# Patient Record
Sex: Female | Born: 1990
Health system: Southern US, Community
[De-identification: ages and names within clinical notes are randomized; demographics above are authoritative.]

## PROBLEM LIST (undated history)

## (undated) DIAGNOSIS — R87629 Unspecified abnormal cytological findings in specimens from vagina: Secondary | ICD-10-CM

## (undated) DIAGNOSIS — F4310 Post-traumatic stress disorder, unspecified: Secondary | ICD-10-CM

## (undated) DIAGNOSIS — N912 Amenorrhea, unspecified: Secondary | ICD-10-CM

## (undated) DIAGNOSIS — F909 Attention-deficit hyperactivity disorder, unspecified type: Secondary | ICD-10-CM

## (undated) DIAGNOSIS — G43909 Migraine, unspecified, not intractable, without status migrainosus: Secondary | ICD-10-CM

## (undated) HISTORY — DX: Unspecified abnormal cytological findings in specimens from vagina: R87.629

## (undated) HISTORY — PX: SHOULDER ARTHROSCOPY: SHX128

## (undated) HISTORY — DX: Post-traumatic stress disorder, unspecified: F43.10

## (undated) HISTORY — DX: Amenorrhea, unspecified: N91.2

## (undated) HISTORY — DX: Attention-deficit hyperactivity disorder, unspecified type: F90.9

## (undated) HISTORY — DX: Migraine, unspecified, not intractable, without status migrainosus: G43.909

---
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
## 2010-01-10 DIAGNOSIS — F4310 Post-traumatic stress disorder, unspecified: Secondary | ICD-10-CM

## 2010-01-10 HISTORY — DX: Post-traumatic stress disorder, unspecified: F43.10

## 2010-05-12 HISTORY — PX: KNEE ARTHROSCOPY: SUR90

## 2014-02-01 ENCOUNTER — Encounter: Payer: Self-pay | Admitting: *Deleted

## 2014-02-23 ENCOUNTER — Encounter: Payer: Self-pay | Admitting: Family Medicine

## 2014-02-23 ENCOUNTER — Ambulatory Visit (INDEPENDENT_AMBULATORY_CARE_PROVIDER_SITE_OTHER): Payer: No Typology Code available for payment source | Admitting: Family Medicine

## 2014-02-23 VITALS — BP 104/68 | HR 88 | Temp 98.8°F | Resp 18 | Ht 62.25 in | Wt 168.0 lb

## 2014-02-23 DIAGNOSIS — E669 Obesity, unspecified: Secondary | ICD-10-CM

## 2014-02-23 DIAGNOSIS — E663 Overweight: Secondary | ICD-10-CM | POA: Insufficient documentation

## 2014-02-23 DIAGNOSIS — G43909 Migraine, unspecified, not intractable, without status migrainosus: Secondary | ICD-10-CM

## 2014-02-23 DIAGNOSIS — R5383 Other fatigue: Secondary | ICD-10-CM

## 2014-02-23 DIAGNOSIS — Z0000 Encounter for general adult medical examination without abnormal findings: Secondary | ICD-10-CM

## 2014-02-23 DIAGNOSIS — F909 Attention-deficit hyperactivity disorder, unspecified type: Secondary | ICD-10-CM

## 2014-02-23 DIAGNOSIS — Z113 Encounter for screening for infections with a predominantly sexual mode of transmission: Secondary | ICD-10-CM

## 2014-02-23 DIAGNOSIS — R5381 Other malaise: Secondary | ICD-10-CM

## 2014-02-23 DIAGNOSIS — F901 Attention-deficit hyperactivity disorder, predominantly hyperactive type: Secondary | ICD-10-CM

## 2014-02-23 MED ORDER — METHYLPHENIDATE HCL 10 MG PO TABS: ORAL_TABLET | ORAL | Status: DC

## 2014-02-23 MED ORDER — RIZATRIPTAN BENZOATE 10 MG PO TBDP: 10.0000 mg | ORAL_TABLET | ORAL | Status: DC | PRN

## 2014-02-23 MED ORDER — ONDANSETRON HCL 8 MG PO TABS: 8.0000 mg | ORAL_TABLET | Freq: Three times a day (TID) | ORAL | Status: DC | PRN

## 2014-02-23 MED ORDER — NORGESTIM-ETH ESTRAD TRIPHASIC 0.18/0.215/0.25 MG-25 MCG PO TABS: 1.0000 | ORAL_TABLET | Freq: Every day | ORAL | Status: DC

## 2014-02-23 MED ORDER — METHYLPHENIDATE HCL ER (OSM) 18 MG PO TBCR: 18.0000 mg | EXTENDED_RELEASE_TABLET | Freq: Every day | ORAL | Status: DC

## 2014-02-23 NOTE — Patient Instructions (Signed)
F/U 4 weeks for medications Try Melatonin We will call with lab results  Restart Concerta  Release of records- previous PCP, need PAP Smear

## 2014-02-23 NOTE — Assessment & Plan Note (Signed)
Restart Concerta at 18mg  once a day,  Ritalin prn evening 5-10mg  Recheck 4 weeks
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
## 2014-02-23 NOTE — Addendum Note (Signed)
Addended by: Milinda AntisURHAM, Eilee Schader F on: 02/23/2014 05:42 PM   Modules accepted: Level of Service

## 2014-02-24 LAB — CBC WITH DIFFERENTIAL/PLATELET
BASOS ABS: 0 10*3/uL (ref 0.0–0.1)
Basophils Relative: 0 % (ref 0–1)
EOS ABS: 0.1 10*3/uL (ref 0.0–0.7)
Eosinophils Relative: 1 % (ref 0–5)
HCT: 35.9 % — ABNORMAL LOW (ref 36.0–46.0)
Hemoglobin: 11.9 g/dL — ABNORMAL LOW (ref 12.0–15.0)
LYMPHS PCT: 31 % (ref 12–46)
Lymphs Abs: 1.9 10*3/uL (ref 0.7–4.0)
MCH: 29.5 pg (ref 26.0–34.0)
MCHC: 33.1 g/dL (ref 30.0–36.0)
MCV: 88.9 fL (ref 78.0–100.0)
Monocytes Absolute: 0.5 10*3/uL (ref 0.1–1.0)
Monocytes Relative: 9 % (ref 3–12)
NEUTROS PCT: 59 % (ref 43–77)
Neutro Abs: 3.5 10*3/uL (ref 1.7–7.7)
PLATELETS: 273 10*3/uL (ref 150–400)
RBC: 4.04 MIL/uL (ref 3.87–5.11)
RDW: 13.1 % (ref 11.5–15.5)
WBC: 6 10*3/uL (ref 4.0–10.5)

## 2014-02-24 LAB — HIV ANTIBODY (ROUTINE TESTING W REFLEX): HIV 1&2 Ab, 4th Generation: NONREACTIVE

## 2014-02-24 LAB — TSH: TSH: 1.68 u[IU]/mL (ref 0.350–4.500)

## 2014-02-24 LAB — LIPID PANEL
CHOL/HDL RATIO: 2.6 ratio
Cholesterol: 126 mg/dL (ref 0–200)
HDL: 49 mg/dL (ref >39)
LDL CALC: 64 mg/dL (ref 0–99)
Triglycerides: 67 mg/dL (ref <150)
VLDL: 13 mg/dL (ref 0–40)

## 2014-02-24 LAB — COMPREHENSIVE METABOLIC PANEL
ALK PHOS: 59 U/L (ref 39–117)
ALT: 8 U/L (ref 0–35)
AST: 13 U/L (ref 0–37)
Albumin: 3.9 g/dL (ref 3.5–5.2)
BILIRUBIN TOTAL: 0.2 mg/dL (ref 0.2–1.2)
BUN: 8 mg/dL (ref 6–23)
CALCIUM: 8.8 mg/dL (ref 8.4–10.5)
CO2: 25 mEq/L (ref 19–32)
CREATININE: 0.73 mg/dL (ref 0.50–1.10)
Chloride: 107 mEq/L (ref 96–112)
Glucose, Bld: 82 mg/dL (ref 70–99)
Potassium: 4.5 mEq/L (ref 3.5–5.3)
Sodium: 140 mEq/L (ref 135–145)
Total Protein: 6.5 g/dL (ref 6.0–8.3)

## 2014-03-23 ENCOUNTER — Ambulatory Visit (INDEPENDENT_AMBULATORY_CARE_PROVIDER_SITE_OTHER): Payer: No Typology Code available for payment source | Admitting: Family Medicine

## 2014-03-23 ENCOUNTER — Encounter: Payer: Self-pay | Admitting: Family Medicine

## 2014-03-23 VITALS — BP 104/58 | HR 88 | Temp 98.2°F | Resp 16 | Ht 62.0 in | Wt 165.0 lb

## 2014-03-23 DIAGNOSIS — F909 Attention-deficit hyperactivity disorder, unspecified type: Secondary | ICD-10-CM

## 2014-03-23 DIAGNOSIS — G4700 Insomnia, unspecified: Secondary | ICD-10-CM

## 2014-03-23 DIAGNOSIS — F901 Attention-deficit hyperactivity disorder, predominantly hyperactive type: Secondary | ICD-10-CM

## 2014-03-23 MED ORDER — METHYLPHENIDATE HCL 10 MG PO TABS: ORAL_TABLET | ORAL | Status: DC

## 2014-03-23 MED ORDER — METHYLPHENIDATE HCL ER (OSM) 27 MG PO TBCR: 27.0000 mg | EXTENDED_RELEASE_TABLET | ORAL | Status: DC

## 2014-03-23 MED ORDER — SUVOREXANT 10 MG PO TABS: 1.0000 | ORAL_TABLET | Freq: Every day | ORAL | Status: DC

## 2014-03-23 NOTE — Progress Notes (Signed)
Patient ID: Lindsey Edwards, female   DOB: 02/03/1991, 23 y.o.   MRN: [id number]   Subjective:    Patient ID: Lindsey RuddyJessica Edwards, female    DOB: 06/10/1991, 23 y.o.   MRN: [id number]  Patient presents for 4 week F/U  patient here for interim followup on her medications. Last visit she was restarted on Concerta 18 mg and Ritalin 5-10 mg daily as needed. She states the Ritalin is doing well the evening however the Concerta is not quite strong enough she will like to increase 27 mg. She will be started her emergency response course next week.  Insomnia she continues to have difficulty sleeping. She did request lorazepam states that this has worked she has been on doxepin, trazodone, amitriptyline, Elavil, melatonin with no improvement.    Review Of Systems:  GEN- denies fatigue, fever, weight loss,weakness, recent illness HEENT- denies eye drainage, change in vision, nasal discharge, CVS- denies chest pain, palpitations RESP- denies SOB, cough, wheeze Neuro- denies headache, dizziness, syncope, seizure activity       Objective:    BP 104/58  Pulse 88  Temp(Src) 98.2 F (36.8 C) (Oral)  Resp 16  Ht 5\' 2"  (1.575 m)  Wt 165 lb (74.844 kg)  BMI 30.17 kg/m2  LMP 03/18/2014 GEN- NAD, alert and oriented x3 Psych- normal affect and mood        Assessment & Plan:      Problem List Items Addressed This Visit   None      Note: This dictation was prepared with Dragon dictation along with smaller phrase technology. Any transcriptional errors that result from this process are unintentional.

## 2014-03-23 NOTE — Assessment & Plan Note (Signed)
She's tried multiple medication and has had some side effects to trazodone. I do not want to put her on another habit-forming medication and she is currently on Ritalin and Concerta. I've given her prescription for the new Belsomra- she was given a voucher for 10 days and then a prescription card.

## 2014-03-23 NOTE — Patient Instructions (Addendum)
F/U 3 months for medications Concerta increased to 27mg   Try the new sleep medication Belsomra 10mg  at bedtime

## 2014-03-23 NOTE — Assessment & Plan Note (Signed)
Increase Concerta 27 mg extended release she was given 3 prescriptions one for June July and August I also refilled her Ritalin

## 2014-06-25 ENCOUNTER — Ambulatory Visit: Payer: No Typology Code available for payment source | Admitting: Family Medicine

## 2014-06-29 ENCOUNTER — Telehealth: Payer: Self-pay | Admitting: Family Medicine

## 2014-06-29 ENCOUNTER — Telehealth: Payer: Self-pay | Admitting: *Deleted

## 2014-06-29 MED ORDER — NORGESTIM-ETH ESTRAD TRIPHASIC 0.18/0.215/0.25 MG-25 MCG PO TABS: 1.0000 | ORAL_TABLET | Freq: Every day | ORAL | Status: DC

## 2014-06-29 NOTE — Telephone Encounter (Signed)
Patient left a message on machine saying she would like to talk about one of her current medications please call her back at [phone]

## 2014-06-29 NOTE — Telephone Encounter (Signed)
This has been taken care of.

## 2014-06-29 NOTE — Telephone Encounter (Signed)
Call placed to patient.   Patient reports that she can no longer afford the Ortho Tri-Cyclen Lo, but requires birth control.   Requested to be changed back to her previous contraception, but can not remember the name.   MD please advise.

## 2014-06-29 NOTE — Telephone Encounter (Signed)
Send in Tri Sprintec 1 tab daily

## 2014-06-29 NOTE — Telephone Encounter (Signed)
Prescription sent to pharmacy. .   Call placed to patient and patient made aware.  

## 2014-06-29 NOTE — Telephone Encounter (Signed)
Pt called back stating the name of the medication is called Tri-Sprintec, states she was on Tri-cyclen and it was expensive and having side effects from it prefers to get back on tri sprintec, please advise

## 2014-08-09 ENCOUNTER — Telehealth: Payer: Self-pay | Admitting: Family Medicine

## 2014-08-09 NOTE — Telephone Encounter (Signed)
Patient is calling to get rx for her her concerta [phone]

## 2014-08-09 NOTE — Telephone Encounter (Signed)
Give 1 month supply only, needs appt before any further refills

## 2014-08-09 NOTE — Telephone Encounter (Signed)
Ok to refill??  Last office visit/ refill 03/23/2014, #3 prescriptions given.   Patient NS for 3 month F/U on 06/25/2014.

## 2014-08-10 ENCOUNTER — Telehealth: Payer: Self-pay | Admitting: Family Medicine

## 2014-08-10 MED ORDER — METHYLPHENIDATE HCL ER (OSM) 27 MG PO TBCR: 27.0000 mg | EXTENDED_RELEASE_TABLET | ORAL | Status: DC

## 2014-08-10 NOTE — Telephone Encounter (Signed)
[phone]  Pt is wanting to know if she will have to come in every month to pick up her methylphenidate (CONCERTA) 27 MG PO CR tablet

## 2014-08-10 NOTE — Telephone Encounter (Signed)
Rx printed for provider signature.  Pt made aware RX ready and will need appt before further refills

## 2014-08-13 NOTE — Telephone Encounter (Signed)
lmtrc

## 2014-08-28 ENCOUNTER — Encounter: Payer: Self-pay | Admitting: Family Medicine

## 2014-08-28 ENCOUNTER — Ambulatory Visit (INDEPENDENT_AMBULATORY_CARE_PROVIDER_SITE_OTHER): Payer: No Typology Code available for payment source | Admitting: Family Medicine

## 2014-08-28 VITALS — BP 110/58 | HR 88 | Temp 98.4°F | Resp 16 | Ht 62.0 in | Wt 162.0 lb

## 2014-08-28 DIAGNOSIS — F901 Attention-deficit hyperactivity disorder, predominantly hyperactive type: Secondary | ICD-10-CM

## 2014-08-28 DIAGNOSIS — Z23 Encounter for immunization: Secondary | ICD-10-CM

## 2014-08-28 DIAGNOSIS — G43009 Migraine without aura, not intractable, without status migrainosus: Secondary | ICD-10-CM

## 2014-08-28 MED ORDER — METHYLPHENIDATE HCL ER (OSM) 27 MG PO TBCR: 27.0000 mg | EXTENDED_RELEASE_TABLET | ORAL | Status: DC

## 2014-08-28 MED ORDER — METHYLPHENIDATE HCL 10 MG PO TABS: ORAL_TABLET | ORAL | Status: DC

## 2014-08-28 MED ORDER — RIZATRIPTAN BENZOATE 10 MG PO TBDP: 10.0000 mg | ORAL_TABLET | ORAL | Status: DC | PRN

## 2014-08-28 MED ORDER — ONDANSETRON HCL 8 MG PO TABS: 8.0000 mg | ORAL_TABLET | Freq: Three times a day (TID) | ORAL | Status: DC | PRN

## 2014-08-28 NOTE — Addendum Note (Signed)
Addended by: Phillips OdorSIX, CHRISTINA H on: 08/28/2014 12:42 PM   Modules accepted: Orders

## 2014-08-28 NOTE — Addendum Note (Signed)
Addended by: Milinda AntisURHAM, Chauntelle Azpeitia F on: 08/28/2014 11:57 AM   Modules accepted: Level of Service

## 2014-08-28 NOTE — Assessment & Plan Note (Signed)
Controlled with current meds.

## 2014-08-28 NOTE — Progress Notes (Signed)
Patient ID: Lindsey RuddyJessica Edwards, female   DOB: 08/22/1991, 23 y.o.   MRN: [id number]   Subjective:    Patient ID: Lindsey RuddyJessica Edwards, female    DOB: 10/16/1990, 23 y.o.   MRN: [id number]  Patient presents for Follow-up  H here for follow-up on medications. Initially stated was scheduled for physical however she did have a physical exam back in May but she did not want a Pap smear at that time states she is on her period today therefore still due for Pap smear. She's taken her ADHD medications as prescribed she is on Concerta 27 mg and Ritalin 10 mg in the evening. 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 She has no new concerns today.  Her migraines are fairly well controlled with the Maxalt and Zofran if needed. Of note she had to discontinue the Bell Belsomra which was being used for sleep due to side effects of palpitations and headache   Review Of Systems:  GEN- denies fatigue, fever, weight loss,weakness, recent illness HEENT- denies eye drainage, change in vision, nasal discharge, CVS- denies chest pain, palpitations RESP- denies SOB, cough, wheeze ABD- denies N/V, change in stools, abd pain GU- denies dysuria, hematuria, dribbling, incontinence MSK- denies joint pain, muscle aches, injury Neuro- denies headache, dizziness, syncope, seizure activity       Objective:    BP 110/58 mmHg  Pulse 88  Temp(Src) 98.4 F (36.9 C) (Oral)  Resp 16  Ht 5\' 2"  (1.575 m)  Wt 162 lb (73.483 kg)  BMI 29.62 kg/m2  LMP 08/27/2014 GEN- NAD, alert and oriented x3 HEENT- PERRL, EOMI, non injected sclera, pink conjunctiva, MMM, oropharynx clear CVS- RRR, no murmur RESP-CTAB Psych- normal affect and mood         Assessment & Plan:      Problem List Items Addressed This Visit    None      Note: This dictation was prepared with Dragon dictation along with smaller phrase technology. Any transcriptional errors that result from this process are unintentional.

## 2014-08-28 NOTE — Patient Instructions (Signed)
Return for PAP Smear within the next 4 weeks Flu shot given Continue current medication F/U 3 months for medications

## 2014-08-28 NOTE — Assessment & Plan Note (Signed)
Continue concerta 27mg  and ritatlin, discussed importance of follow-up with visits for her meds Give 3 month supply - script for Dec, Jan, Feb Initially printed Nov script but this was discarded

## 2014-08-31 ENCOUNTER — Telehealth: Payer: Self-pay | Admitting: *Deleted

## 2014-08-31 NOTE — Telephone Encounter (Signed)
Received e-mail request for PA for Maxalt.   PA submitted.   Dx: Migraines (G43.909)  Reports therapeutic failure of Imitrex.

## 2014-09-25 ENCOUNTER — Ambulatory Visit: Payer: No Typology Code available for payment source | Admitting: Family Medicine

## 2014-10-01 ENCOUNTER — Encounter: Payer: Self-pay | Admitting: Family Medicine

## 2014-10-01 ENCOUNTER — Ambulatory Visit (INDEPENDENT_AMBULATORY_CARE_PROVIDER_SITE_OTHER): Payer: No Typology Code available for payment source | Admitting: Family Medicine

## 2014-10-01 VITALS — BP 106/64 | HR 78 | Temp 98.4°F | Resp 16 | Ht 62.0 in | Wt 161.0 lb

## 2014-10-01 DIAGNOSIS — J209 Acute bronchitis, unspecified: Secondary | ICD-10-CM

## 2014-10-01 MED ORDER — AZITHROMYCIN 250 MG PO TABS: ORAL_TABLET | ORAL | Status: DC

## 2014-10-01 NOTE — Progress Notes (Signed)
Patient ID: Lindsey RuddyJessica Edwards, female   DOB: 11/30/1990, 23 y.o.   MRN: [id number]   Subjective:    Patient ID: Lindsey RuddyJessica Edwards, female    DOB: 12/17/1990, 23 y.o.   MRN: [id number]  Patient presents for Illness  Cough with production x 5 days, croupy cough per report, fever Tmax 102F, she missed a few days of work. 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 [employer]    Review Of Systems:  GEN- +fatigue,+ fever, weight loss,weakness, recent illness HEENT- denies eye drainage, change in vision,+ nasal discharge, CVS- denies chest pain, palpitations RESP- denies SOB, +cough, wheeze ABD- denies N/V, change in stools, abd pain GU- denies dysuria, hematuria, dribbling, incontinence MSK- denies joint pain, muscle aches, injury Neuro- denies headache, dizziness, syncope, seizure activity       Objective:    BP 106/64 mmHg  Pulse 78  Temp(Src) 98.4 F (36.9 C) (Oral)  Resp 16  Ht 5\' 2"  (1.575 m)  Wt 161 lb (73.029 kg)  BMI 29.44 kg/m2  SpO2 98% GEN- NAD, alert and oriented x3, non toxic, afebrile HEENT- PERRL, EOMI, non injected sclera, pink conjunctiva, MMM, oropharynx mild injection, TM clear bilat no effusion, no maxillary sinus tenderness, +  Nasal drainage  Neck- Supple, shotty LAD CVS- RRR, no murmur RESP-rhonchi right base, good air movement, no wheeze, wet cough, normal WOB at rest EXT- No edema Pulses- Radial 2+         Assessment & Plan:      Problem List Items Addressed This Visit    None    Visit Diagnoses    Acute bronchitis, unspecified organism    -  Primary    Cover with zpak,  fever, some rhonchi, croupy cough, if not better, CXR, OTC cough meds       Note: This dictation was prepared with Dragon dictation along with smaller phrase technology. Any transcriptional errors that result from this process are unintentional.

## 2014-10-01 NOTE — Patient Instructions (Signed)
Continue DAYQUIL Start the antibiotics as prescribed  Plenty of fluids  F/U as previous

## 2014-10-12 HISTORY — PX: COLPOSCOPY: SHX161

## 2014-10-18 NOTE — Telephone Encounter (Signed)
Prescription does not require PA.

## 2015-04-18 ENCOUNTER — Encounter: Payer: Self-pay | Admitting: Physician Assistant

## 2015-04-18 ENCOUNTER — Ambulatory Visit (INDEPENDENT_AMBULATORY_CARE_PROVIDER_SITE_OTHER): Payer: No Typology Code available for payment source | Admitting: Physician Assistant

## 2015-04-18 VITALS — BP 110/60 | HR 88 | Temp 97.9°F | Resp 20 | Wt 161.0 lb

## 2015-04-18 DIAGNOSIS — B9689 Other specified bacterial agents as the cause of diseases classified elsewhere: Principal | ICD-10-CM

## 2015-04-18 DIAGNOSIS — J988 Other specified respiratory disorders: Secondary | ICD-10-CM

## 2015-04-18 MED ORDER — METHYLPHENIDATE HCL 10 MG PO TABS: ORAL_TABLET | ORAL | Status: DC

## 2015-04-18 MED ORDER — AZITHROMYCIN 250 MG PO TABS: ORAL_TABLET | ORAL | Status: DC

## 2015-04-18 MED ORDER — METHYLPHENIDATE HCL ER (OSM) 27 MG PO TBCR: 27.0000 mg | EXTENDED_RELEASE_TABLET | ORAL | Status: DC

## 2015-04-18 MED ORDER — PREDNISONE 20 MG PO TABS: ORAL_TABLET | ORAL | Status: DC

## 2015-04-19 NOTE — Progress Notes (Signed)
Patient ID: Lindsey Edwards MRN: [id number], DOB: Jan 27, 1991, 24 y.o. Date of Encounter: 04/19/2015, 11:35 AM    Chief Complaint:  Chief Complaint  Patient presents with  . Sinusitis    sinus pressure     HPI: 24 y.o. year old female says this started with a cough, which has mostly resolved. After developing cough, developed sinus congestion and pressure. Getting nothing to come out of her nose. Says it is all stopped up and congetsed. Using Nyquil.  Left work early Tuesday. Was off work Wednesday. Scheduled to go in tonight and go in Friday morning and also on Saturday. Works in Brewing technologist at [employer]  Also missed work 2 days last week- Wed 6/29 and Th 6/30. Also c/o muscle tightness and pain on both sides of neck and both sides of posterior aspect of neck.      Home Meds:   Outpatient Prescriptions Prior to Visit  Medication Sig Dispense Refill  . Melatonin 10 MG TABS Take 1 tablet by mouth at bedtime.    . Norgestimate-Ethinyl Estradiol Triphasic 0.18/0.215/0.25 MG-25 MCG tab Take 1 tablet by mouth daily. 1 Package 11  . ondansetron (ZOFRAN) 8 MG tablet Take 1 tablet (8 mg total) by mouth every 8 (eight) hours as needed for nausea (at onset of migraine). 20 tablet 3  . rizatriptan (MAXALT-MLT) 10 MG disintegrating tablet Take 1 tablet (10 mg total) by mouth as needed for migraine. May repeat in 2 hours if needed 6 tablet 3  . azithromycin (ZITHROMAX) 250 MG tablet Take 2 tablets x 1 day, then 1 tablet daily x 4 days (Patient not taking: Reported on 04/18/2015) 6 tablet 0  . methylphenidate (CONCERTA) 27 MG PO CR tablet Take 1 tablet (27 mg total) by mouth every morning. 30 tablet 0  . methylphenidate (RITALIN) 10 MG tablet Take 1 tablet q PM PRN when Concerta has/ is wearing off. 30 tablet 0   No facility-administered medications prior to visit.    Allergies:  Allergies  Allergen Reactions  . Keflex [Cephalexin] Anaphylaxis  . Penicillins Anaphylaxis  . Septra  [Sulfamethoxazole-Trimethoprim] Anaphylaxis  . Sulfa Antibiotics Anaphylaxis  . Pecan Pollen Hives      Review of Systems: See HPI for pertinent ROS. All other ROS negative.    Physical Exam: Blood pressure 110/60, pulse 88, temperature 97.9 F (36.6 C), temperature source Oral, resp. rate 20, weight 161 lb (73.029 kg)., Body mass index is 29.44 kg/(m^2). General:  WNWD WF. Appears in no acute distress. HEENT: Normocephalic, atraumatic, eyes without discharge, sclera non-icteric, nares are without discharge. Bilateral auditory canals clear, TM's are without perforation, pearly grey and translucent with reflective cone of light bilaterally. Oral cavity moist, posterior pharynx without exudate, erythema, peritonsillar abscess. Postive sinus tenderness with percussion of bilateral frontal and maxillary sinuses.   Neck: Supple. No thyromegaly. No lymphadenopathy.Severe tenderness with palpation along both sides of neck over trapezius and also bilateral sides of posterior aspect of neck.  Lungs: Clear bilaterally to auscultation without wheezes, rales, or rhonchi. Breathing is unlabored. Heart: Regular rhythm. No murmurs, rubs, or gallops. Msk:  Strength and tone normal for age. Extremities/Skin: Warm and dry.  Neuro: Alert and oriented X 3. Moves all extremities spontaneously. Gait is normal. CNII-XII grossly in tact. Psych:  Responds to questions appropriately with a normal affect.     ASSESSMENT AND PLAN:  24 y.o. year old female with  1. Bacterial respiratory infection Allergy to PCN and Sulfa.  Treat with meds below. F/U  if symptoms do not resolve with this.  Note given for work.  - azithromycin (ZITHROMAX) 250 MG tablet; Day 1: Take 2 daily.  Days 2-5: Take 1 daily.  Dispense: 6 tablet; Refill: 0 - predniSONE (DELTASONE) 20 MG tablet; Take 3 daily for 2 days, then 2 daily for 2 days, then 1 daily for 2 days.  Dispense: 12 tablet; Refill: 0  Regarding neck pain:  Discussed awareness  of body position especially at work. Heat, stretch.   Signed, [address]Mary Beth TooeleDixon, [place], [facility] 04/19/2015 11:35 AM

## 2015-06-24 ENCOUNTER — Encounter (HOSPITAL_COMMUNITY): Payer: Self-pay | Admitting: Emergency Medicine

## 2015-06-24 ENCOUNTER — Emergency Department (HOSPITAL_COMMUNITY): Payer: Worker's Compensation

## 2015-06-24 ENCOUNTER — Emergency Department (HOSPITAL_COMMUNITY)
Admission: EM | Admit: 2015-06-24 | Discharge: 2015-06-24 | Disposition: A | Discharge status: Home / Self Care | Payer: Worker's Compensation | Attending: Emergency Medicine | Admitting: Emergency Medicine

## 2015-06-24 DIAGNOSIS — S4992XA Unspecified injury of left shoulder and upper arm, initial encounter: Secondary | ICD-10-CM | POA: Diagnosis present

## 2015-06-24 DIAGNOSIS — Z793 Long term (current) use of hormonal contraceptives: Secondary | ICD-10-CM | POA: Diagnosis not present

## 2015-06-24 DIAGNOSIS — Y9289 Other specified places as the place of occurrence of the external cause: Secondary | ICD-10-CM | POA: Diagnosis not present

## 2015-06-24 DIAGNOSIS — Y9389 Activity, other specified: Secondary | ICD-10-CM | POA: Insufficient documentation

## 2015-06-24 DIAGNOSIS — Z87891 Personal history of nicotine dependence: Secondary | ICD-10-CM | POA: Diagnosis not present

## 2015-06-24 DIAGNOSIS — Y990 Civilian activity done for income or pay: Secondary | ICD-10-CM | POA: Insufficient documentation

## 2015-06-24 DIAGNOSIS — S448X2A Injury of other nerves at shoulder and upper arm level, left arm, initial encounter: Secondary | ICD-10-CM | POA: Insufficient documentation

## 2015-06-24 DIAGNOSIS — Z792 Long term (current) use of antibiotics: Secondary | ICD-10-CM | POA: Insufficient documentation

## 2015-06-24 DIAGNOSIS — S199XXA Unspecified injury of neck, initial encounter: Secondary | ICD-10-CM | POA: Diagnosis not present

## 2015-06-24 DIAGNOSIS — Z880 Allergy status to penicillin: Secondary | ICD-10-CM | POA: Insufficient documentation

## 2015-06-24 DIAGNOSIS — S4492XA Injury of unspecified nerve at shoulder and upper arm level, left arm, initial encounter: Secondary | ICD-10-CM

## 2015-06-24 DIAGNOSIS — F909 Attention-deficit hyperactivity disorder, unspecified type: Secondary | ICD-10-CM | POA: Diagnosis not present

## 2015-06-24 DIAGNOSIS — Z79899 Other long term (current) drug therapy: Secondary | ICD-10-CM | POA: Insufficient documentation

## 2015-06-24 DIAGNOSIS — S40012A Contusion of left shoulder, initial encounter: Secondary | ICD-10-CM

## 2015-06-24 DIAGNOSIS — W208XXA Other cause of strike by thrown, projected or falling object, initial encounter: Secondary | ICD-10-CM | POA: Insufficient documentation

## 2015-06-24 MED ORDER — PREDNISONE 50 MG PO TABS
60.0000 mg | ORAL_TABLET | Freq: Once | ORAL | Status: AC
  Administered 2015-06-24: 60 mg via ORAL
  Filled 2015-06-24 (×2): qty 1

## 2015-06-24 MED ORDER — METHOCARBAMOL 500 MG PO TABS
1000.0000 mg | ORAL_TABLET | Freq: Once | ORAL | Status: AC
  Administered 2015-06-24: 1000 mg via ORAL
  Filled 2015-06-24: qty 2

## 2015-06-24 MED ORDER — METHOCARBAMOL 500 MG PO TABS: 1000.0000 mg | ORAL_TABLET | Freq: Four times a day (QID) | ORAL | Status: AC

## 2015-06-24 MED ORDER — ACETAMINOPHEN-CODEINE #3 300-30 MG PO TABS: 1.0000 | ORAL_TABLET | Freq: Four times a day (QID) | ORAL | Status: DC | PRN

## 2015-06-24 MED ORDER — PREDNISONE 10 MG PO TABS: ORAL_TABLET | ORAL | Status: DC

## 2015-06-24 NOTE — Discharge Instructions (Signed)
Neurapraxia °Neurapraxia is a temporary loss of nerve function. It does not cause permanent damage to a nerve. If your foot "falls asleep," that is a type of neurapraxia. It will go away as soon as you start moving your foot. A more serious neurapraxia could take up to 6 weeks to go away. °CAUSES  °· Anything that strains a nerve can cause neurapraxia. The nerve might be stretched or twisted. Something might press or pound on it and cause decreased blood flow to the nerve. Causes of neurapraxia can include: °¨ Bones that break (fracture) or move out of place (dislocation). This can cause the nerves to stretch or twist out of their normal position. This happens most often in the arms and shoulders. °¨ Neck strain when the head moves suddenly, such as in a car crash. This is called whiplash (cervical neurapraxia). It can also happen while playing sports. For example, a football injury called a stinger is a type of neurapraxia. It causes severe pain to shoot down an arm. °¨ Stretching the neck too far from the shoulder. This damages the nerves that go into the shoulder, arm, and hand. It causes numbness and muscle weakness. This condition is called brachial plexus neurapraxia. °· Repeated or prolonged pressure can cause decreased blood flow to the nerve (ischemic neurapraxia). Such causes of neurapraxia can include: °¨ A cast or bandage that is too tight around an injured arm or leg. This limits blood flow to the nerves and causes a condition called compartment syndrome. This condition develops when pressure builds up around muscles and nerves. °¨ Infection and disease. This can cut off blood flow to the nerve. °¨ Very cold temperatures. °¨ Sleeping in a position that limits blood flow to your leg or arm. °SIGNS AND SYMPTOMS °· Numbness and tingling. °· Muscle weakness. °· Burning pain. °· Cool skin. °DIAGNOSIS  °Neurapraxia is diagnosed through: °· A physical exam. This will include asking questions about your health.  Your health care provider will ask about any symptoms you are having. Your health care provider may also: °¨ Test how strong your muscles are. °¨ Check feeling (sensation) in various areas of your body. A very light touch or pricks with a pin may be used. °¨ Check whether you have signs of nerve damage on one side of the body or both. °· Tests such as: °¨ Electromyography (EMG). This test measures electrical activity in a muscle. It shows whether the nerve that supplies the muscle is working. °¨ Nerve conduction studies (NCS). They measure the flow of electricity through a nerve. °¨ Magnetic resonance imaging (MRI). This is a machine that uses magnets and a computer to create pictures of your nerves. °TREATMENT  °Treatment aims to ease pain and swelling and to provide support while your body heals. °· Medicine may include: °¨ Pain medicine. °¨ Antidepressants. °¨ Seizure medicine. °¨ Medicine to reduce swelling. °· For support, options may include: °¨ Braces, walkers, or crutches. °¨ Physical therapy. Having a specialist work with you often speeds healing. It can also help prevent stiffness and future damage. °¨ Surgery. This may be needed if broken or dislocated bones are part of the problem. Surgery also may be done to relieve pressure on nerves or to restore normal blood flow to nerves and muscles. °¨ Electrical stimulators. These devices send pulses of electricity into the muscles. The aim is to bring back movement. °HOME CARE INSTRUCTIONS °What you need to do at home will vary. It will depend on your treatment   plan and the type of neurapraxia you have. In general:  Take medicine as told by your health care provider. Follow the directions carefully.  Rest. Give your body time to heal.  Use any splints, braces, or other support devices as directed. If you have questions about their use, ask your health care provider.  Start physical therapy, if that is suggested. Ask if it is okay to practice the  exercises at home, too.  If areas of your body are numb, take care to protect them from burns or other injury.  If you had surgery, you will need to care for your surgical cut (incision). Ask for instructions before you leave the hospital.  Keep all follow-up appointments with your health care provider. SEEK MEDICAL CARE IF:   You have any questions about your medicine.  Numbness or muscle weakness continues.  Pain continues, even after taking pain medicine. SEEK IMMEDIATE MEDICAL CARE IF:   Your pain suddenly becomes severe.  Numbness or weakness gets much worse.  Your muscles start to twitch or you have muscle spasms. Document Released: 03/02/2011 Document Revised: 02/12/2014 Document Reviewed: 03/02/2011 [facility] Patient Information 2015 [place], [place]. This information is not intended to replace advice given to you by your health care provider. Make sure you discuss any questions you have with your health care provider.    Take your next dose of prednisone tomorrow evening.  Use the other medications as prescribed.  Use caution with Tylenol #3 as this medication can make you sleepy.  As discussed, and ice pack as much as possible for the next 2 days to help reduce swelling.  You may adding had starting on Thursday 20 minutes several times daily.  Use the sling for comfort, but make sure you are maintaining range of motion in your shoulder joint.

## 2015-06-24 NOTE — ED Provider Notes (Signed)
CSN: [id number]     Arrival date & time 06/24/15  1143 History  This chart was scribed for non-physician practitioner, Burgess Amor, PA-C working with Devoria Albe, MD, by Jarvis Morgan, ED Scribe. This patient was seen in room APFT23/APFT23 and the patient's care was started at 4:15 PM.     Chief Complaint  Patient presents with  . Shoulder Injury    The history is provided by the patient. No language interpreter was used.    HPI Comments: Lindsey Edwards is a 24 y.o. female who presents to the Emergency Department complaining of constant, mild, left shoulder pain s/p shoulder injury onset 9 hours ago. Pt states she was at work and moving boxes when a box fell off a shelf onto her left shoulder and struck the corner of her "deltoid and collarbone". She reports associated alternating tingling and numbness in her left arm. She endorses the pain and tingling is exacerbated with movement and when she holds her arm in dependency. Pt states the if she does not move the arm it starts to have a numbness feeling. She has not taken any meds PTA. Pt denies any other injuries. Pt is left hand dominant. She denies any swelling or weakness.  PCP: Milinda Antis, MD  Past Medical History  Diagnosis Date  . PTSD (post-traumatic stress disorder) 01/2010    dx from [facility]  . ADHD (attention deficit hyperactivity disorder) 03/2004   Past Surgical History  Procedure Laterality Date  . Tonsillectomy Bilateral 04/1995  . Adenoidectomy Bilateral 09/1994  . Knee arthroscopy Right 05/2010    debridement   Family History  Problem Relation Age of Onset  . Depression Mother   . Heart disease Mother   . Arthritis Maternal Grandmother   . Hypothyroidism Maternal Grandmother   . Mental illness Maternal Grandmother     OCD  . Hyperlipidemia Maternal Grandmother   . Heart disease Maternal Grandmother   . Cancer Maternal Grandfather   . Hearing loss Maternal Grandfather   . Hypertension Maternal Grandfather    . Asthma Paternal Grandfather    Social History  Substance Use Topics  . Smoking status: Former Smoker    Quit date: 11/28/2013  . Smokeless tobacco: Former Neurosurgeon    Types: Snuff    Quit date: 02/21/2014  . Alcohol Use: No   OB History    No data available     Review of Systems  Musculoskeletal: Positive for myalgias and arthralgias. Negative for joint swelling.  Neurological: Positive for tingling and numbness. Negative for weakness.      Allergies  Keflex; Penicillins; Septra; Sulfa antibiotics; and Pecan pollen  Home Medications   Prior to Admission medications   Medication Sig Start Date End Date Taking? Authorizing Provider  acetaminophen-codeine (TYLENOL #3) 300-30 MG per tablet Take 1-2 tablets by mouth every 6 (six) hours as needed for moderate pain. 06/24/15   Burgess Amor, PA-C  azithromycin (ZITHROMAX) 250 MG tablet Day 1: Take 2 daily.  Days 2-5: Take 1 daily. 04/18/15   Dorena Bodo, PA-C  Melatonin 10 MG TABS Take 1 tablet by mouth at bedtime.    Historical Provider, MD  methocarbamol (ROBAXIN) 500 MG tablet Take 2 tablets (1,000 mg total) by mouth 4 (four) times daily. 06/24/15 07/04/15  Burgess Amor, PA-C  methylphenidate (CONCERTA) 27 MG PO CR tablet Take 1 tablet (27 mg total) by mouth every morning. 04/18/15   Dorena Bodo, PA-C  methylphenidate (RITALIN) 10 MG tablet Take 1 tablet q  PM PRN when Concerta has/ is wearing off. 04/18/15   Dorena Bodo, PA-C  Norgestimate-Ethinyl Estradiol Triphasic 0.18/0.215/0.25 MG-25 MCG tab Take 1 tablet by mouth daily. 06/29/14   Salley Scarlet, MD  ondansetron (ZOFRAN) 8 MG tablet Take 1 tablet (8 mg total) by mouth every 8 (eight) hours as needed for nausea (at onset of migraine). 08/28/14   Salley Scarlet, MD  predniSONE (DELTASONE) 10 MG tablet 6, 5, 4, 3, 2 then 1 tablet by mouth daily for 6 days total. 06/24/15   Burgess Amor, PA-C  rizatriptan (MAXALT-MLT) 10 MG disintegrating tablet Take 1 tablet (10 mg total) by mouth as needed  for migraine. May repeat in 2 hours if needed 08/28/14   Salley Scarlet, MD   Triage Vitals: BP 127/70 mmHg  Pulse 90  Temp(Src) 98 F (36.7 C) (Oral)  Resp 18  Ht 5\' 3"  (1.6 m)  Wt 164 lb (74.39 kg)  BMI 29.06 kg/m2  SpO2 100%  LMP 05/27/2015  Physical Exam  Constitutional: She appears well-developed and well-nourished.  HENT:  Head: Atraumatic.  Neck: Normal range of motion.  Cardiovascular:  Pulses equal bilaterally Radial pulses are full and equal  Musculoskeletal: She exhibits tenderness.       Left shoulder: She exhibits tenderness.  Tender to palpation of left trapezius Tender at left base of neck which radiates down to superior scapula Muscle spasm appreciated.Full ROM of c-spine with increased pain with rightward rotation.  Equal grip strength.  Sensation normal in bilateral arms.    Neurological: She is alert. She has normal strength. She displays normal reflexes. No sensory deficit.  Skin: Skin is warm and dry.  Faint pink erythema across her superior left shoulder w/ no hematoma and skin is intact  Psychiatric: She has a normal mood and affect.    ED Course  Procedures (including critical care time)  DIAGNOSTIC STUDIES: Oxygen Saturation is 100% on RA, normal by my interpretation.    COORDINATION OF CARE: 1:12 PM- Will order left shoulder x-ray  4:14 PM- Pt advised of plan for treatment and pt agrees. Advised and will prescribe antiinflammatories, prednisone, and muscle relaxant, Robaxin, for pain. Also advised rest and applying cold packs to the shoulder.  Labs Review Labs Reviewed - No data to display  Imaging Review Dg Shoulder Left  06/24/2015   CLINICAL DATA:  Acute left shoulder pain following heavy box falling on left shoulder last night. Initial encounter.  EXAM: LEFT SHOULDER - 2+ VIEW  COMPARISON:  None.  FINDINGS: There is no evidence of fracture or dislocation. There is no evidence of arthropathy or other focal bone abnormality. Soft tissues  are unremarkable.  IMPRESSION: Negative.   Electronically Signed   By: Harmon Pier M.D.   On: 06/24/2015 13:47   I have personally reviewed and evaluated these images and lab results as part of my medical decision-making.   EKG Interpretation None      MDM   Final diagnoses:  Shoulder contusion, left, initial encounter  Neuropraxia of left upper extremity, initial encounter      Radiological studies were viewed, interpreted and considered during the medical decision making and disposition process. I agree with radiologists reading.  Results were also discussed with patient. Suspect deep tissue contusion/inflammation as source of sx.  Pt advised ice therapy x 2 days, may add heat on day 3. Prednisone taper, robaxin, tylenol #3 prn. F/u with pcp for a recheck in one week. Discussed that numbness sx should improve  with time and tx but should be rechecked if not improving over the next week.  Sling provided for comfort.discussed to take care to maintain ROM while using the sling.   I personally performed the services described in this documentation, which was scribed in my presence. The recorded information has been reviewed and is accurate.   Burgess Amor, PA-C 06/25/15 [id number]  Devoria Albe, MD 06/25/15 [phone]

## 2015-06-24 NOTE — ED Notes (Signed)
Pt was moving boxes in freezer when a box fell off shelf and corner of it struck her "deltoid and my collar bone" -- Lindsey Edwards comp

## 2015-07-05 ENCOUNTER — Telehealth: Payer: Self-pay | Admitting: Family Medicine

## 2015-07-05 ENCOUNTER — Other Ambulatory Visit: Payer: Self-pay | Admitting: Family Medicine

## 2015-07-05 NOTE — Telephone Encounter (Signed)
Patient calling requesting refill on her methylphenidate (CONCERTA) 27 MG she is out of medication

## 2015-07-05 NOTE — Telephone Encounter (Signed)
Refill appropriate and filled per protocol. 

## 2015-07-05 NOTE — Telephone Encounter (Signed)
Patient is supposed to start taking these tomorrow.

## 2015-07-05 NOTE — Telephone Encounter (Signed)
Refill denied.   Request has already been responded to.

## 2015-07-08 MED ORDER — METHYLPHENIDATE HCL ER (OSM) 27 MG PO TBCR: 27.0000 mg | EXTENDED_RELEASE_TABLET | ORAL | Status: DC

## 2015-07-08 NOTE — Telephone Encounter (Signed)
Ok to refill??  Last office visit/ refill 04/18/2015.

## 2015-07-08 NOTE — Telephone Encounter (Signed)
Okay to get 30 days only, she needs OV to discuss her medications, no further refills until she comes in

## 2015-07-08 NOTE — Telephone Encounter (Signed)
Prescription printed and patient made aware to come to office to pick up.   Patient has CPE scheduled on 07/23/2015.

## 2015-07-23 ENCOUNTER — Ambulatory Visit (INDEPENDENT_AMBULATORY_CARE_PROVIDER_SITE_OTHER): Payer: No Typology Code available for payment source | Admitting: Family Medicine

## 2015-07-23 ENCOUNTER — Encounter: Payer: Self-pay | Admitting: Family Medicine

## 2015-07-23 VITALS — BP 106/64 | HR 88 | Temp 98.2°F | Resp 16 | Ht 63.0 in | Wt 167.0 lb

## 2015-07-23 DIAGNOSIS — F901 Attention-deficit hyperactivity disorder, predominantly hyperactive type: Secondary | ICD-10-CM

## 2015-07-23 DIAGNOSIS — Z0000 Encounter for general adult medical examination without abnormal findings: Secondary | ICD-10-CM | POA: Diagnosis not present

## 2015-07-23 DIAGNOSIS — Z124 Encounter for screening for malignant neoplasm of cervix: Secondary | ICD-10-CM

## 2015-07-23 DIAGNOSIS — Z113 Encounter for screening for infections with a predominantly sexual mode of transmission: Secondary | ICD-10-CM

## 2015-07-23 DIAGNOSIS — E663 Overweight: Secondary | ICD-10-CM | POA: Diagnosis not present

## 2015-07-23 DIAGNOSIS — Z23 Encounter for immunization: Secondary | ICD-10-CM | POA: Diagnosis not present

## 2015-07-23 LAB — CBC WITH DIFFERENTIAL/PLATELET
BASOS PCT: 0 % (ref 0–1)
Basophils Absolute: 0 10*3/uL (ref 0.0–0.1)
Eosinophils Absolute: 0.1 10*3/uL (ref 0.0–0.7)
Eosinophils Relative: 2 % (ref 0–5)
HEMATOCRIT: 36.9 % (ref 36.0–46.0)
HEMOGLOBIN: 12.2 g/dL (ref 12.0–15.0)
Lymphocytes Relative: 32 % (ref 12–46)
Lymphs Abs: 2.3 10*3/uL (ref 0.7–4.0)
MCH: 29.9 pg (ref 26.0–34.0)
MCHC: 33.1 g/dL (ref 30.0–36.0)
MCV: 90.4 fL (ref 78.0–100.0)
MONO ABS: 0.6 10*3/uL (ref 0.1–1.0)
MONOS PCT: 9 % (ref 3–12)
MPV: 9.7 fL (ref 8.6–12.4)
NEUTROS ABS: 4.1 10*3/uL (ref 1.7–7.7)
Neutrophils Relative %: 57 % (ref 43–77)
Platelets: 349 10*3/uL (ref 150–400)
RBC: 4.08 MIL/uL (ref 3.87–5.11)
RDW: 13.2 % (ref 11.5–15.5)
WBC: 7.2 10*3/uL (ref 4.0–10.5)

## 2015-07-23 LAB — COMPREHENSIVE METABOLIC PANEL
ALBUMIN: 3.8 g/dL (ref 3.6–5.1)
ALT: 9 U/L (ref 6–29)
AST: 13 U/L (ref 10–30)
Alkaline Phosphatase: 60 U/L (ref 33–115)
BUN: 7 mg/dL (ref 7–25)
CALCIUM: 8.8 mg/dL (ref 8.6–10.2)
CHLORIDE: 103 mmol/L (ref 98–110)
CO2: 27 mmol/L (ref 20–31)
Creat: 0.7 mg/dL (ref 0.50–1.10)
Glucose, Bld: 81 mg/dL (ref 70–99)
POTASSIUM: 4.4 mmol/L (ref 3.5–5.3)
SODIUM: 137 mmol/L (ref 135–146)
Total Bilirubin: 0.3 mg/dL (ref 0.2–1.2)
Total Protein: 6.1 g/dL (ref 6.1–8.1)

## 2015-07-23 LAB — WET PREP FOR TRICH, YEAST, CLUE
Clue Cells Wet Prep HPF POC: NONE SEEN
Trich, Wet Prep: NONE SEEN
Yeast Wet Prep HPF POC: NONE SEEN

## 2015-07-23 LAB — LIPID PANEL
CHOL/HDL RATIO: 2.5 ratio (ref <=5.0)
Cholesterol: 138 mg/dL (ref 125–200)
HDL: 55 mg/dL (ref >=46)
LDL CALC: 65 mg/dL (ref <130)
TRIGLYCERIDES: 91 mg/dL (ref <150)
VLDL: 18 mg/dL (ref <30)

## 2015-07-23 MED ORDER — METHYLPHENIDATE HCL 10 MG PO TABS: ORAL_TABLET | ORAL | Status: DC

## 2015-07-23 MED ORDER — NORGESTIM-ETH ESTRAD TRIPHASIC 0.18/0.215/0.25 MG-35 MCG PO TABS: 1.0000 | ORAL_TABLET | Freq: Every day | ORAL | Status: DC

## 2015-07-23 MED ORDER — RIZATRIPTAN BENZOATE 10 MG PO TBDP: 10.0000 mg | ORAL_TABLET | ORAL | Status: DC | PRN

## 2015-07-23 MED ORDER — METHYLPHENIDATE HCL ER (OSM) 36 MG PO TBCR: 36.0000 mg | EXTENDED_RELEASE_TABLET | ORAL | Status: DC

## 2015-07-23 NOTE — Assessment & Plan Note (Addendum)
Increase concerta to  once a day , continue Ritalin Discussed regular follow up appointments and not taking other peoples meds, if her name is not on the bottle, even if it is the "same"

## 2015-07-23 NOTE — Addendum Note (Signed)
Addended by: Phillips Odor on: 07/23/2015 02:21 PM   Modules accepted: Orders

## 2015-07-23 NOTE — Addendum Note (Signed)
Addended by: Phillips Odor on: 07/23/2015 02:19 PM   Modules accepted: Orders

## 2015-07-23 NOTE — Patient Instructions (Addendum)
Release of records- Urgent Care in [place]  Flu shot given HPV shot given We will call with labs  F/U 6 months

## 2015-07-23 NOTE — Progress Notes (Signed)
Patient ID: Lindsey Edwards, female   DOB: 25-Jun-1991, 24 y.o.   MRN: [id number]   Subjective:    Patient ID: Lindsey Edwards, female    DOB: 12-06-1990, 24 y.o.   MRN: [id number]  Patient presents for CPE   Patient here for complete physical exam. She also needs a note releasing her back to work. 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   She states that she had been taking some of her cousins left over ADHD medication was the same 27 mg which she does not feel like the doses enough. I had not seen her since last December and there was no way she had enough medication to last her until Midsummer when she came in for refill. 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 She will like to increase the dose of the Concerta her family as well as her job notices that she still has some difficulties concentrating.   She is doing well with regards to her migraine she takes her medications as needed. She is taking her birth control. She is overdue for Pap smear her menstrual cycle is to start tomorrow.    Review Of Systems:  GEN- denies fatigue, fever, weight loss,weakness, recent illness HEENT- denies eye drainage, change in vision, nasal discharge, CVS- denies chest pain, palpitations RESP- denies SOB, cough, wheeze ABD- denies N/V, change in stools, abd pain GU- denies dysuria, hematuria, dribbling, incontinence MSK- denies joint pain, muscle aches, injury Neuro- denies headache, dizziness, syncope, seizure activity       Objective:    BP 106/64 mmHg  Pulse 88  Temp(Src) 98.2 F (36.8 C) (Oral)  Resp 16  Ht  (1.6 m)  Wt 167 lb (75.751 kg)  BMI 29.59 kg/m2  LMP 06/27/2015 GEN-  NAD, alert and oriented x3 HEENT- PERRL, EOMI, non injected sclera, pink conjunctiva, MMM, oropharynx clear Neck- Supple, no thyromegaly CVS- RRR, no murmur Breast- normal symmetry, no nipple inversion,no nipple drainage, no nodules or lumps felt Nodes- no axillary nodes RESP-CTABD ABD-NABS- Soft NTND GU- normal external genitalia, vaginal mucosa pink and moist, cervix visualized no growth, no blood form os, minimal thin clear discharge, no CMT, no ovarian masses, uterus normal size MSK- FROM neck, Upper and lower ext, normal gait NEURO- CNII-XII intact, Normal tone DTR symmetric, Motor 5/5 equal bilat, sensation in tact  EXT- No edema Pulses- Radial, DP- 2+        Assessment & Plan:      Problem List Items Addressed This Visit    Overweight   ADHD, predominantly hyperactive-impulsive subtype    Increase concerta to  once a day , continue Ritalin       Other Visit Diagnoses    Routine general medical examination at a health care facility    -  Primary    CPE done, PAP Done, fasting labs, Flu shot, HPV Vaccine, meds refilled, released to work    Relevant Orders    CBC with Differential/Platelet    Comprehensive metabolic panel    Lipid panel    HIV antibody    Pap smear for cervical cancer screening        Relevant Orders  PAP, ThinPrep ASCUS Rflx HPV Rflx Type       Note: This dictation was prepared with Dragon dictation along with smaller phrase technology. Any transcriptional errors that result from this process are unintentional.

## 2015-07-24 ENCOUNTER — Other Ambulatory Visit: Payer: Self-pay | Admitting: *Deleted

## 2015-07-24 LAB — PAP THINPREP ASCUS RFLX HPV RFLX TYPE

## 2015-07-24 LAB — HIV ANTIBODY (ROUTINE TESTING W REFLEX): HIV 1&2 Ab, 4th Generation: NONREACTIVE

## 2015-07-24 LAB — GC/CHLAMYDIA PROBE AMP
CT Probe RNA: NEGATIVE
GC Probe RNA: NEGATIVE

## 2015-07-24 MED ORDER — METRONIDAZOLE 500 MG PO TABS: 500.0000 mg | ORAL_TABLET | Freq: Two times a day (BID) | ORAL | Status: DC

## 2015-11-21 ENCOUNTER — Ambulatory Visit (INDEPENDENT_AMBULATORY_CARE_PROVIDER_SITE_OTHER): Payer: No Typology Code available for payment source | Admitting: Physician Assistant

## 2015-11-21 ENCOUNTER — Encounter: Payer: Self-pay | Admitting: Physician Assistant

## 2015-11-21 VITALS — BP 100/60 | HR 78 | Temp 98.8°F | Resp 18 | Wt 165.0 lb

## 2015-11-21 DIAGNOSIS — J988 Other specified respiratory disorders: Secondary | ICD-10-CM | POA: Diagnosis not present

## 2015-11-21 DIAGNOSIS — B9689 Other specified bacterial agents as the cause of diseases classified elsewhere: Principal | ICD-10-CM

## 2015-11-21 MED ORDER — AZITHROMYCIN 250 MG PO TABS: ORAL_TABLET | ORAL | Status: DC

## 2015-11-21 NOTE — Progress Notes (Signed)
Patient ID: Lindsey Edwards MRN: [id number], DOB: May 15, 1991, 25 y.o. Date of Encounter: 11/21/2015, 3:51 PM    Chief Complaint:  Chief Complaint  Patient presents with  . cough x 1 week    wet non productive, congestion getting worse,  mom nurse said lungs sound diminished,  today also poss sinuses     HPI: 26 y.o. year old female presents with above.   She states that today is day 8 of having this cough. Says it has been a productive heavy congested cough. Says this morning she blew thick yellow-green mucus out of her nose. Has had no sore throat and no fevers or chills.     Home Meds:   Outpatient Prescriptions Prior to Visit  Medication Sig Dispense Refill  . Melatonin 10 MG TABS Take 1 tablet by mouth at bedtime.    . methylphenidate (RITALIN) 10 MG tablet Take 1 tablet q PM PRN when Concerta has/ is wearing off. 30 tablet 0  . methylphenidate 36 MG PO CR tablet Take 1 tablet (36 mg total) by mouth every morning. 30 tablet 0  . Norgestimate-Ethinyl Estradiol Triphasic (TRI-SPRINTEC) 0.18/0.215/0.25 MG-35 MCG tablet Take 1 tablet by mouth daily. 28 tablet 11  . ondansetron (ZOFRAN) 8 MG tablet Take 1 tablet (8 mg total) by mouth every 8 (eight) hours as needed for nausea (at onset of migraine). 20 tablet 3  . rizatriptan (MAXALT-MLT) 10 MG disintegrating tablet Take 1 tablet (10 mg total) by mouth as needed for migraine. May repeat in 2 hours if needed 6 tablet 3  . metroNIDAZOLE (FLAGYL) 500 MG tablet Take 1 tablet (500 mg total) by mouth 2 (two) times daily. 14 tablet 0   No facility-administered medications prior to visit.    Allergies:  Allergies  Allergen Reactions  . Keflex [Cephalexin] Anaphylaxis  . Penicillins Anaphylaxis  . Septra [Sulfamethoxazole-Trimethoprim] Anaphylaxis  . Sulfa Antibiotics Anaphylaxis  . Pecan Pollen Hives      Review of Systems: See HPI for pertinent ROS. All other ROS negative.    Physical Exam: Blood pressure 100/60, pulse 78,  temperature 98.8 F (37.1 C), temperature source Oral, resp. rate 18, weight 165 lb (74.844 kg)., Body mass index is 29.24 kg/(m^2). General:  Obese WF. Appears in no acute distress. HEENT: Normocephalic, atraumatic, eyes without discharge, sclera non-icteric, nares are without discharge. Bilateral auditory canals clear, TM's are without perforation, pearly grey and translucent with reflective cone of light bilaterally. Oral cavity moist, posterior pharynx without exudate, erythema, peritonsillar abscess. No tenderness with percussion to frontal or maxillary sinuses bilaterally.  Neck: Supple. No thyromegaly. No lymphadenopathy. Lungs: Clear bilaterally to auscultation without wheezes, rales, or rhonchi. Breathing is unlabored. She has good breath sounds and air movement throughout bilaterally and lungs are clear with no wheezing or rhonchi.  Heart: Regular rhythm. No murmurs, rubs, or gallops. Msk:  Strength and tone normal for age. Extremities/Skin: Warm and dry.  No rashes. Neuro: Alert and oriented X 3. Moves all extremities spontaneously. Gait is normal. CNII-XII grossly in tact. Psych:  Responds to questions appropriately with a normal affect.     ASSESSMENT AND PLAN:  25 y.o. year old female with  1. Bacterial respiratory infection Allergies include penicillin and sulfa. She is to take antibiotic as directed and complete all of it. Follow-up if symptoms do not resolve within 1 week after completion of antibiotic. Can use over-the-counter decongestants and cough medications for symptom relief if needed in the interim. - azithromycin (ZITHROMAX) 250 MG  tablet; Day 1: Take 2 daily. Days 2-5: Take 1 daily.  Dispense: 6 tablet; Refill: 0   Signed, [address] [place], [place], [facility] 11/21/2015 3:51 PM

## 2015-12-24 ENCOUNTER — Other Ambulatory Visit: Payer: Self-pay | Admitting: Family Medicine

## 2015-12-24 NOTE — Telephone Encounter (Signed)
Refill appropriate and filled per protocol. 

## 2016-01-22 ENCOUNTER — Ambulatory Visit: Payer: No Typology Code available for payment source | Admitting: Family Medicine

## 2016-01-28 ENCOUNTER — Encounter: Payer: Self-pay | Admitting: Family Medicine

## 2016-03-11 ENCOUNTER — Encounter: Payer: Self-pay | Admitting: Family Medicine

## 2016-03-11 ENCOUNTER — Ambulatory Visit (INDEPENDENT_AMBULATORY_CARE_PROVIDER_SITE_OTHER): Payer: Commercial Managed Care - PPO | Admitting: Family Medicine

## 2016-03-11 VITALS — BP 108/56 | HR 72 | Temp 98.9°F | Resp 12 | Ht 63.0 in | Wt 163.0 lb

## 2016-03-11 DIAGNOSIS — G43009 Migraine without aura, not intractable, without status migrainosus: Secondary | ICD-10-CM | POA: Diagnosis not present

## 2016-03-11 DIAGNOSIS — G4700 Insomnia, unspecified: Secondary | ICD-10-CM

## 2016-03-11 DIAGNOSIS — F901 Attention-deficit hyperactivity disorder, predominantly hyperactive type: Secondary | ICD-10-CM | POA: Diagnosis not present

## 2016-03-11 MED ORDER — ONDANSETRON HCL 8 MG PO TABS: ORAL_TABLET | ORAL | Status: DC

## 2016-03-11 MED ORDER — METHYLPHENIDATE HCL ER (OSM) 36 MG PO TBCR: 36.0000 mg | EXTENDED_RELEASE_TABLET | ORAL | Status: DC

## 2016-03-11 MED ORDER — METHYLPHENIDATE HCL 10 MG PO TABS: ORAL_TABLET | ORAL | Status: DC

## 2016-03-11 MED ORDER — NORGESTIM-ETH ESTRAD TRIPHASIC 0.18/0.215/0.25 MG-35 MCG PO TABS: 1.0000 | ORAL_TABLET | Freq: Every day | ORAL | Status: DC

## 2016-03-11 MED ORDER — RIZATRIPTAN BENZOATE 10 MG PO TBDP: 10.0000 mg | ORAL_TABLET | ORAL | Status: DC | PRN

## 2016-03-11 NOTE — Progress Notes (Signed)
Patient ID: Lindsey RuddyJessica Edwards, female   DOB: 04/25/1991, 25 y.o.   MRN: [id number]   Subjective:    Patient ID: Lindsey RuddyJessica Edwards, female    DOB: 09/25/1991, 25 y.o.   MRN: [id number]  Patient presents for Medication Review/ Refill Here to discuss medications for refills. 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 She uses Concerta as well as the Ritalin. She's not had a problem with the medications. Next  Migraine she has not had any significant migraines but she does use the Maxalt and Zofran when they come up.  Her birth control is not causing any difficulties her cycles are regular she would like to continue this.    Review Of Systems:  GEN- denies fatigue, fever, weight loss,weakness, recent illness HEENT- denies eye drainage, change in vision, nasal discharge, CVS- denies chest pain, palpitations RESP- denies SOB, cough, wheeze ABD- denies N/V, change in stools, abd pain GU- denies dysuria, hematuria, dribbling, incontinence MSK- denies joint pain, muscle aches, injury Neuro- denies headache, dizziness, syncope, seizure activity       Objective:    BP 108/56 mmHg  Pulse 72  Temp(Src) 98.9 F (37.2 C) (Oral)  Resp 12  Ht 5\' 3"  (1.6 m)  Wt 163 lb (73.936 kg)  BMI 28.88 kg/m2  LMP 02/13/2016 (Approximate) GEN- NAD, alert and oriented x3 HEENT- PERRL, EOMI, non injected sclera, pink conjunctiva, MMM, oropharynx clear CVS- RRR, no murmur RESP-CTAB Psych- normal affect and mood  EXT- No edema Pulses- Radial 2+        Assessment & Plan:      Problem List Items Addressed This Visit    Migraines - Primary    Maxalt and Zofran to be continued. Migraines are well controlled      Relevant Medications   rizatriptan (MAXALT-MLT) 10 MG disintegrating tablet   Insomnia    She uses melatonin as needed over-the-counter      ADHD,  predominantly hyperactive-impulsive subtype    Continue Concerta and Ritalin 2 was given 2 prescriptions for the Concerta this lasted quite some time and she only uses it when she goes to work         Note: This dictation was prepared with Office managerDragon dictation along with smaller Lobbyistphrase technology. Any transcriptional errors that result from this process are unintentional.

## 2016-03-11 NOTE — Patient Instructions (Signed)
Continue current medications F/U 6 months for Physical  

## 2016-03-11 NOTE — Assessment & Plan Note (Signed)
Continue Concerta and Ritalin 2 was given 2 prescriptions for the Concerta this lasted quite some time and she only uses it when she goes to work

## 2016-03-11 NOTE — Assessment & Plan Note (Signed)
Maxalt and Zofran to be continued. Migraines are well controlled

## 2016-03-11 NOTE — Assessment & Plan Note (Signed)
She uses melatonin as needed over-the-counter

## 2016-06-19 ENCOUNTER — Telehealth: Payer: Self-pay | Admitting: Family Medicine

## 2016-06-19 NOTE — Telephone Encounter (Signed)
Patient left vm asking if someone could call her back she would like some information about this years flu shot. Called pt to see what information she needed. She would like to know what the cost of the flu shot is for a patient who doesn't have insurance. I advised pt I would find out nad call her back.  CB# [phone]

## 2016-06-24 NOTE — Telephone Encounter (Signed)
Called patient I left message with Onalee HuaDavid asking him to have patient call back. I gave him our number and asked her to press option 1.   When patient calls back we can give her this information: The cost is $82.00 for the flu shot. If she truly doesn't have insurance she will receive a 55% discount which comes out at $45.10.

## 2016-08-03 NOTE — Telephone Encounter (Signed)
Left another vm asking pt to return my call so I can advise her of this and to r/s an appt she has on 09/11/16.

## 2016-09-11 ENCOUNTER — Encounter: Payer: Commercial Managed Care - PPO | Admitting: Family Medicine

## 2016-10-19 ENCOUNTER — Encounter: Payer: Self-pay | Admitting: Physician Assistant

## 2016-10-19 ENCOUNTER — Ambulatory Visit (INDEPENDENT_AMBULATORY_CARE_PROVIDER_SITE_OTHER): Payer: Commercial Managed Care - PPO | Admitting: Physician Assistant

## 2016-10-19 VITALS — BP 100/62 | HR 75 | Temp 97.8°F | Resp 16 | Ht 62.0 in | Wt 174.8 lb

## 2016-10-19 DIAGNOSIS — Z0000 Encounter for general adult medical examination without abnormal findings: Secondary | ICD-10-CM

## 2016-10-19 DIAGNOSIS — Z0289 Encounter for other administrative examinations: Secondary | ICD-10-CM

## 2016-10-19 NOTE — Progress Notes (Signed)
Patient ID: Lindsey Edwards MRN: [id number], DOB: March 21, 1991, 26 y.o. Date of Encounter: @DATE @  Chief Complaint:  Chief Complaint  Patient presents with  . Annual Exam    phq score 0    HPI: 26 y.o. year old female  presents for above.   She has forms to complete for her job. She works as a IT sales professional. The form just requires documentation of her vision screen and musculoskeletal physical exam. Other parts of physical exam are optional. No additional evaluation necessary for the form.  She has no complaints or concerns to address today.   Past Medical History:  Diagnosis Date  . ADHD (attention deficit hyperactivity disorder) 03/2004  . Migraine   . PTSD (post-traumatic stress disorder) 01/2010   dx from [facility]     Home Meds: Outpatient Medications Prior to Visit  Medication Sig Dispense Refill  . Melatonin 10 MG TABS Take 1 tablet by mouth at bedtime.    . methylphenidate (RITALIN) 10 MG tablet Take 1 tablet q PM PRN when Concerta has/ is wearing off. 30 tablet 0  . methylphenidate 36 MG PO CR tablet Take 1 tablet (36 mg total) by mouth every morning. 30 tablet 0  . Norgestimate-Ethinyl Estradiol Triphasic (TRI-SPRINTEC) 0.18/0.215/0.25 MG-35 MCG tablet Take 1 tablet by mouth daily. 28 tablet 11  . ondansetron (ZOFRAN) 8 MG tablet TAKE ONE TABLET BY MOUTH EVERY 8 HOURS AS NEEDED FOR NAUSEA (AT ONSET OF MIGRAINE) 20 tablet 1  . rizatriptan (MAXALT-MLT) 10 MG disintegrating tablet Take 1 tablet (10 mg total) by mouth as needed for migraine. May repeat in 2 hours if needed 6 tablet 3   No facility-administered medications prior to visit.     Allergies:  Allergies  Allergen Reactions  . Keflex [Cephalexin] Anaphylaxis  . Penicillins Anaphylaxis  . Septra [Sulfamethoxazole-Trimethoprim] Anaphylaxis  . Sulfa Antibiotics Anaphylaxis  . Pecan Pollen Hives    Social History   Social History  . Marital status: Single    Spouse name: N/A  . Number of children: N/A    . Years of education: N/A   Occupational History  . Not on file.   Social History Main Topics  . Smoking status: Former Smoker    Quit date: 11/28/2013  . Smokeless tobacco: Former Neurosurgeon    Types: Snuff    Quit date: 02/21/2014  . Alcohol use No  . Drug use: No  . Sexual activity: Yes    Birth control/ protection: Condom, Pill   Other Topics Concern  . Not on file   Social History Narrative  . No narrative on file    Family History  Problem Relation Age of Onset  . Depression Mother   . Heart disease Mother   . Arthritis Maternal Grandmother   . Hypothyroidism Maternal Grandmother   . Mental illness Maternal Grandmother     OCD  . Hyperlipidemia Maternal Grandmother   . Heart disease Maternal Grandmother   . Cancer Maternal Grandfather   . Hearing loss Maternal Grandfather   . Hypertension Maternal Grandfather   . Asthma Paternal Grandfather      Review of Systems:  See HPI for pertinent ROS. All other ROS negative.    Physical Exam: Blood pressure 100/62, pulse 75, temperature 97.8 F (36.6 C), temperature source Oral, resp. rate 16, height 5\' 2"  (1.575 m), weight 174 lb 12.8 oz (79.3 kg), last menstrual period 10/12/2016, SpO2 98 %., Body mass index is 31.97 kg/m. General: WNWD WF Appears in no acute  distress. Head: Normocephalic, atraumatic, eyes without discharge, sclera non-icteric, nares are without discharge. Bilateral auditory canals clear, TM's are without perforation, pearly grey and translucent with reflective cone of light bilaterally. Oral cavity moist, posterior pharynx without exudate, erythema, peritonsillar abscess.  Neck: Supple. No thyromegaly. No lymphadenopathy. Lungs: Clear bilaterally to auscultation without wheezes, rales, or rhonchi. Breathing is unlabored. Heart: RRR with S1 S2. No murmurs, rubs, or gallops. Abdomen: Soft, non-tender, non-distended with normoactive bowel sounds. No hepatomegaly. No rebound/guarding. No obvious abdominal  masses. Musculoskeletal:  Strength and tone normal for age. Neck full range of motion Shoulders full range of motion Hips full range of motion Knees: Flexion and extension normal. No laxity with varus and valgus maneuvers and anterior drawer. Ankles full range of motion and normal. Spine has no scoliosis. Extremities/Skin: Warm and dry.  No rashes or suspicious lesions. Neuro: Alert and oriented X 3. Moves all extremities spontaneously. Gait is normal. CNII-XII grossly in tact. Psych:  Responds to questions appropriately with a normal affect.   Vision screen is 20/20 on the left, 20/20 on the right, 20/20 bilaterally-- all of this was performed with out correction (no contact lenses, no eyeglasses)  ASSESSMENT AND PLAN:  26 y.o. year old female with   1. Encounter for preventive health examination She had screening labs 07/23/15. Lipid panel was excellent. CBC and CMET were normal. Therefore will not repeat labs today. She is only age 89. Immunizations are up-to-date. Physical exam is normal.  2. Encounter for completion of form with patient  Follow up in one year or sooner if needed.   Signed, [id number]Mary Beth ClappertownDixon, [place], [facility] 10/19/2016 10:11 AM

## 2017-04-05 ENCOUNTER — Other Ambulatory Visit: Payer: Self-pay | Admitting: Family Medicine

## 2017-08-19 ENCOUNTER — Telehealth: Payer: Self-pay | Admitting: *Deleted
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
## 2017-08-20 NOTE — Telephone Encounter (Signed)
Call patient have her come in first for OV to discuss

## 2017-08-20 NOTE — Telephone Encounter (Signed)
Appointment scheduled for 08/23/2017.

## 2017-08-23 ENCOUNTER — Encounter: Payer: Self-pay | Admitting: Family Medicine

## 2017-08-23 ENCOUNTER — Ambulatory Visit (INDEPENDENT_AMBULATORY_CARE_PROVIDER_SITE_OTHER): Payer: Self-pay | Admitting: Family Medicine

## 2017-08-23 ENCOUNTER — Other Ambulatory Visit: Payer: Self-pay

## 2017-08-23 VITALS — BP 118/60 | HR 104 | Temp 98.5°F | Resp 16 | Ht 62.0 in | Wt 182.0 lb

## 2017-08-23 DIAGNOSIS — F4310 Post-traumatic stress disorder, unspecified: Secondary | ICD-10-CM

## 2017-08-23 DIAGNOSIS — F5101 Primary insomnia: Secondary | ICD-10-CM

## 2017-08-23 MED ORDER — TRAZODONE HCL 50 MG PO TABS: 50.0000 mg | ORAL_TABLET | Freq: Every day | ORAL | 3 refills | Status: DC

## 2017-08-23 MED ORDER — NORGESTIM-ETH ESTRAD TRIPHASIC 0.18/0.215/0.25 MG-35 MCG PO TABS: 1.0000 | ORAL_TABLET | Freq: Every day | ORAL | 5 refills | Status: DC

## 2017-08-23 MED ORDER — RIZATRIPTAN BENZOATE 10 MG PO TBDP: 10.0000 mg | ORAL_TABLET | ORAL | 3 refills | Status: DC | PRN

## 2017-08-23 MED ORDER — METHYLPHENIDATE HCL ER (OSM) 36 MG PO TBCR: 36.0000 mg | EXTENDED_RELEASE_TABLET | ORAL | 0 refills | Status: DC

## 2017-08-23 MED ORDER — METHYLPHENIDATE HCL 10 MG PO TABS: ORAL_TABLET | ORAL | 0 refills | Status: DC

## 2017-08-23 NOTE — Patient Instructions (Addendum)
F/U Tuesday 27th 3:45pm

## 2017-08-23 NOTE — Assessment & Plan Note (Signed)
PTSD , also with insomnia. She will need intensive therapy Start trazodone 50mg  at bedtime for sleep and mood I recommend she be out of work for next 2 weeks.  Will contact her EAP with her job tomorrow to let them know.  Discussed avoiding ETOH and she has supportive family
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
## 2017-08-26 ENCOUNTER — Telehealth: Payer: Self-pay | Admitting: Family Medicine

## 2017-08-26 NOTE — Telephone Encounter (Signed)
   I spoke with Dava NajjarKathy Melvin from her  EAP Given udpates. She has been following with patient as well [facility] and has f/u appointments there She is aware she is out of work Has good support system. I will see her on 11/27

## 2017-09-07 ENCOUNTER — Ambulatory Visit (INDEPENDENT_AMBULATORY_CARE_PROVIDER_SITE_OTHER): Payer: Self-pay | Admitting: Family Medicine

## 2017-09-07 ENCOUNTER — Other Ambulatory Visit: Payer: Self-pay

## 2017-09-07 ENCOUNTER — Encounter: Payer: Self-pay | Admitting: Family Medicine

## 2017-09-07 VITALS — BP 122/60 | HR 92 | Temp 97.8°F | Resp 14 | Ht 62.0 in | Wt 178.0 lb

## 2017-09-07 DIAGNOSIS — F5101 Primary insomnia: Secondary | ICD-10-CM

## 2017-09-07 DIAGNOSIS — F4310 Post-traumatic stress disorder, unspecified: Secondary | ICD-10-CM

## 2017-09-07 DIAGNOSIS — F901 Attention-deficit hyperactivity disorder, predominantly hyperactive type: Secondary | ICD-10-CM

## 2017-09-07 MED ORDER — DEXMETHYLPHENIDATE HCL ER 15 MG PO CP24: 15.0000 mg | ORAL_CAPSULE | Freq: Every day | ORAL | 0 refills | Status: DC
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
## 2017-09-07 NOTE — Assessment & Plan Note (Signed)
Try Focalin 15mg  XR since she was previously on Concerta 36mg   Continue the prn ritalin as needed

## 2017-09-07 NOTE — Assessment & Plan Note (Signed)
She has good support, will transition back to work this Friday  She will continue with psychotherapy recommend she have some sessions after she returns as well in case there are triggers she was not aware of. Continue trazodone 25mg  once a day

## 2017-09-07 NOTE — Patient Instructions (Signed)
Call for any concerns.  Try the Focalin  F/U 3 months

## 2017-11-11 ENCOUNTER — Other Ambulatory Visit: Payer: Self-pay | Admitting: *Deleted

## 2017-11-22 ENCOUNTER — Encounter: Payer: Self-pay | Admitting: Family Medicine

## 2018-01-07 ENCOUNTER — Telehealth: Payer: Self-pay | Admitting: *Deleted

## 2018-01-07 NOTE — Telephone Encounter (Signed)
Received call from patient.   States that she would like MD to give recommendations on using CBD oil for anxiety in place of Trazodone.   MD please advise.

## 2018-01-07 NOTE — Telephone Encounter (Signed)
I do not recommend CBD oil for any ailments, including anxiety I recommend her take the trazodone

## 2018-01-10 MED ORDER — TRAZODONE HCL 50 MG PO TABS: 50.0000 mg | ORAL_TABLET | Freq: Every day | ORAL | 3 refills | Status: DC

## 2018-01-10 NOTE — Telephone Encounter (Signed)
Call placed to patient. LMTRC.  

## 2018-01-10 NOTE — Telephone Encounter (Signed)
Patient returned call and made aware.   Verbalized understanding.   Requested refill on Trazodone.   Prescription sent to pharmacy.

## 2018-02-04 ENCOUNTER — Encounter: Payer: Self-pay | Admitting: Family Medicine

## 2018-02-04 ENCOUNTER — Ambulatory Visit (INDEPENDENT_AMBULATORY_CARE_PROVIDER_SITE_OTHER): Payer: Worker's Compensation | Admitting: Family Medicine

## 2018-02-04 VITALS — BP 115/72 | HR 83 | Temp 98.1°F | Ht 62.0 in | Wt 164.0 lb

## 2018-02-04 DIAGNOSIS — M25512 Pain in left shoulder: Secondary | ICD-10-CM

## 2018-02-04 MED ORDER — MELOXICAM 15 MG PO TABS: 7.5000 mg | ORAL_TABLET | Freq: Every day | ORAL | 0 refills | Status: DC

## 2018-02-04 MED ORDER — OXYCODONE-ACETAMINOPHEN 10-325 MG PO TABS: 1.0000 | ORAL_TABLET | Freq: Three times a day (TID) | ORAL | 0 refills | Status: DC | PRN

## 2018-02-04 NOTE — Patient Instructions (Signed)
I think you may have a partial rotator cuff tear.  I placed an order for pain medication.  Take these only as directed.  Avoid alcohol.  We discussed that these medications may be sedating.  Make sure you take them with food.  You have prescribed a nonsteroidal anti-inflammatory drug (NSAID) today. This will help with ear pain and inflammation. Please do not take any other NSAIDs (ibuprofen/Motrin/Advil, naproxen/Aleve, meloxicam/Mobic, Voltaren/diclofenac). Please make sure to eat a meal when taking this medication.   Caution:  If you have a history of acid reflux/indigestion, I recommend that you take an antacid (such as Prilosec, Prevacid) daily while on the NSAID.  If you have a history of bleeding disorder, gastric ulcer, are on a blood thinner (like warfarin/Coumadin, Xarelto, Eliquis, etc) please do not take NSAID.  If you have ever had a heart attack, you should not take NSAIDs.  Rotator Cuff Injury Rotator cuff injury is any type of injury to the set of muscles and tendons that make up the stabilizing unit of your shoulder. This unit holds the ball of your upper arm bone (humerus) in the socket of your shoulder blade (scapula). What are the causes? Injuries to your rotator cuff most commonly come from sports or activities that cause your arm to be moved repeatedly over your head. Examples of this include throwing, weight lifting, swimming, or racquet sports. Long lasting (chronic) irritation of your rotator cuff can cause soreness and swelling (inflammation), bursitis, and eventual damage to your tendons, such as a tear (rupture). What are the signs or symptoms? Acute rotator cuff tear:  Sudden tearing sensation followed by severe pain shooting from your upper shoulder down your arm toward your elbow.  Decreased range of motion of your shoulder because of pain and muscle spasm.  Severe pain.  Inability to raise your arm out to the side because of pain and loss of muscle power (large  tears).  Chronic rotator cuff tear:  Pain that usually is worse at night and may interfere with sleep.  Gradual weakness and decreased shoulder motion as the pain worsens.  Decreased range of motion.  Rotator cuff tendinitis:  Deep ache in your shoulder and the outside upper arm over your shoulder.  Pain that comes on gradually and becomes worse when lifting your arm to the side or turning it inward.  How is this diagnosed? Rotator cuff injury is diagnosed through a medical history, physical exam, and imaging exam. The medical history helps determine the type of rotator cuff injury. Your health care provider will look at your injured shoulder, feel the injured area, and ask you to move your shoulder in different positions. X-ray exams typically are done to rule out other causes of shoulder pain, such as fractures. MRI is the exam of choice for the most severe shoulder injuries because the images show muscles and tendons. How is this treated? Chronic tear:  Medicine for pain, such as acetaminophen or ibuprofen.  Physical therapy and range-of-motion exercises may be helpful in maintaining shoulder function and strength.  Steroid injections into your shoulder joint.  Surgical repair of the rotator cuff if the injury does not heal with noninvasive treatment.  Acute tear:  Anti-inflammatory medicines such as ibuprofen and naproxen to help reduce pain and swelling.  A sling to help support your arm and rest your rotator cuff muscles. Long-term use of a sling is not advised. It may cause significant stiffening of the shoulder joint.  Surgery may be considered within a few  weeks, especially in younger, active people, to return the shoulder to full function.  Indications for surgical treatment include the following: ? Age younger than 60 years. ? Rotator cuff tears that are complete. ? Physical therapy, rest, and anti-inflammatory medicines have been used for 6-8 weeks, with no  improvement. ? Employment or sporting activity that requires constant shoulder use.  Tendinitis:  Anti-inflammatory medicines such as ibuprofen and naproxen to help reduce pain and swelling.  A sling to help support your arm and rest your rotator cuff muscles. Long-term use of a sling is not advised. It may cause significant stiffening of the shoulder joint.  Severe tendinitis may require: ? Steroid injections into your shoulder joint. ? Physical therapy. ? Surgery.  Follow these instructions at home:  Apply ice to your injury: ? Put ice in a plastic bag. ? Place a towel between your skin and the bag. ? Leave the ice on for 20 minutes, 2-3 times a day.  If you have a shoulder immobilizer (sling and straps), wear it until told otherwise by your health care provider.  You may want to sleep on several pillows or in a recliner at night to lessen swelling and pain.  Only take over-the-counter or prescription medicines for pain, discomfort, or fever as directed by your health care provider.  Do simple hand squeezing exercises with a soft rubber ball to decrease hand swelling. Contact a health care provider if:  Your shoulder pain increases, or new pain or numbness develops in your arm, hand, or fingers.  Your hand or fingers are colder than your other hand. Get help right away if:  Your arm, hand, or fingers are numb or tingling.  Your arm, hand, or fingers are increasingly swollen and painful, or they turn white or blue. This information is not intended to replace advice given to you by your health care provider. Make sure you discuss any questions you have with your health care provider. Document Released: 09/25/2000 Document Revised: 03/05/2016 Document Reviewed: 05/10/2013 Elsevier Interactive Patient Education  2018 [employer]

## 2018-02-04 NOTE — Progress Notes (Signed)
Subjective: CC: Work injury Date of injury: 02/03/2018 Employer: [employer] PCP: Lindsey Edwards, Lindsey HatchetKawanta F, MD ZOX:WRUEAVWHPI:Lindsey Edwards is a 27 y.o. female presenting to clinic today for:  1. Left shoulder injury Patient is accompanied by her boyfriend today's appointment.  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  Because of the way the steps were situated and because the patient was totally nonambulatory, [name] had to use bilateral upper extremities to bear most of the force to get the patient into their home.  Her report, her partner heard a pop during the lift of the patient as well as appreciated a painful grimace on [name]'s face during the lift.  She notes that she did not have severe discomfort during the lift but did appreciate a strange sensation in the left shoulder.  She thinks that adrenaline perhaps was masking her pain during that time.  She notes that after her patient was successfully got into the home, she did start feeling significant pain in the left shoulder.  She describes the pain as sharp,, stabbing and constant.  It predominantly is in the anterior lateral aspect of the shoulder but does radiate some into the scapula and up the left side of her neck.  Pain is a 6/10.  She notes decreased active range of motion in flexion and abduction of the left shoulder.  She is unable to lift above 90 degrees secondary to pain and weakness.  She is left-hand dominant.  She denies any numbness or tingling in the left upper extremity.  Denies any discoloration or appreciable defects.  No previous injury to the left shoulder.  She has been using ibuprofen and naproxen for pain with little improvement in symptoms.  Last menstrual period 1 week ago.  She uses oral contraceptives and barrier method.  Allergies  Allergen Reactions  . Keflex [Cephalexin] Anaphylaxis  . Penicillins Anaphylaxis  . Septra  [Sulfamethoxazole-Trimethoprim] Anaphylaxis  . Sulfa Antibiotics Anaphylaxis  . Pecan Pollen Hives   Past Medical History:  Diagnosis Date  . ADHD (attention deficit hyperactivity disorder) 03/2004  . Migraine   . PTSD (post-traumatic stress disorder) 01/2010   dx from [facility]   Family History  Problem Relation Age of Onset  . Depression Mother   . Heart disease Mother   . Arthritis Maternal Grandmother   . Hypothyroidism Maternal Grandmother   . Mental illness Maternal Grandmother        OCD  . Hyperlipidemia Maternal Grandmother   . Heart disease Maternal Grandmother   . Cancer Maternal Grandfather   . Hearing loss Maternal Grandfather   . Hypertension Maternal Grandfather   . Asthma Paternal Grandfather    Social History   Tobacco Use  . Smoking status: Former Smoker    Last attempt to quit: 11/28/2013    Years since quitting: 4.1  . Smokeless tobacco: Former NeurosurgeonUser    Types: Snuff    Quit date: 02/21/2014  Substance Use Topics  . Alcohol use: No  . Drug use: No   ROS: Per HPI  Objective: Office vital signs reviewed. BP 115/72   Pulse 83   Temp 98.1 Edwards (36.7 C) (Oral)   Ht 5\' 2"  (1.575 m)   Wt 164 lb (74.4 kg)   LMP 01/10/2018   SpO2 99%   BMI 30.00 kg/m   Physical Examination:  General: Awake, alert, well nourished, well appearing but does appear somewhat uncomfortable. HEENT: Sclera  white, MMM Cardio: regular rate, +2DP Pulm:  normal work of breathing on room air Extremities: warm, well perfused, No edema, cyanosis or clubbing; +2 pulses bilaterally MSK: normal gait and normal station  C-spine: Patient has full active range of motion in flexion and extension.  She does have about a 10 to 15 degree loss and rotation to the right.  Left rotation range of motion preserved.  No midline tenderness to palpation to the C-spine.  Mild paraspinal muscle tenderness to palpation particularly along the left side.  Left shoulder: Patient has reduced active range  of motion in flexion and abduction by about 90 degrees.  She is unable to externally and internally rotate shoulder without pain.  No visible deformities along the [facility] or rotator cuff.  She does have increased tonicity of the trapezius muscle on the left compared to the right.  She has marked pain and weakness with resisted external rotation of the shoulder.  She has a positive empty can sign on the left.  Right shoulder: Patient has full, painless active range of motion in all planes.  No gross deformities of on visual inspection.  5/ 5 upper extremity strength on the right. Skin: dry; intact; no rashes or lesions; no discoloration or ecchymosis Neuro: Reduced strength of left shoulder as above; light touch sensation grossly intact  Assessment/ Plan: 28 y.o. female   1. Acute pain of left shoulder I have a high suspicion for a rotator cuff tear versus tendinitis.  Given the degree of her pain I do favor at least a partial tear.  We discussed that tears would unlikely to be visualized on x-ray.  She would more likely benefit from direct visualization of the rotator cuff under ultrasound versus MRI.  I have referred her to orthopedic surgery for further evaluation and management.  In the interim, I have prescribed her a small amount of Percocet 10 mg to take sparingly as directed as needed.  Avoid alcohol.  Caution sedation.  I have also prescribed her meloxicam 7.5 mg to 15 mg daily as needed pain.  Avoid other NSAIDs.  Take with food and plenty of water.  She was placed in a sling during today's visit.  She is to be totally out of work given the nature of her job until she is evaluated by orthopedics.  Home care instructions reviewed the patient.  She voiced good understanding and will follow-up as needed. - Ambulatory referral to Orthopedic Surgery   Orders Placed This Encounter  Procedures  . DG Shoulder Left    Standing Status:   Future    Standing Expiration Date:   04/07/2019    Order Specific  Question:   Reason for Exam (SYMPTOM  OR DIAGNOSIS REQUIRED)    Answer:   shoulder pain    Order Specific Question:   Is patient pregnant?    Answer:   No    Order Specific Question:   Preferred imaging location?    Answer:   Internal    Order Specific Question:   Radiology Contrast Protocol - do NOT remove file path    Answer:   [email or web address]   Meds ordered this encounter  Medications  . oxyCODONE-acetaminophen (PERCOCET) 10-325 MG tablet    Sig: Take 1 tablet by mouth every 8 (eight) hours as needed for pain.    Dispense:  9 tablet    Refill:  0  . meloxicam (MOBIC) 15 MG tablet    Sig: Take 0.5-1 tablets (7.5-15 mg total) by mouth  daily. (prn pain)    Dispense:  30 tablet    Refill:  0   The Narcotic Database has been reviewed.  There were no red flags.      Raliegh Ip, DO [facility] [phone]

## 2018-02-10 ENCOUNTER — Ambulatory Visit (INDEPENDENT_AMBULATORY_CARE_PROVIDER_SITE_OTHER): Payer: Worker's Compensation

## 2018-02-10 ENCOUNTER — Ambulatory Visit (INDEPENDENT_AMBULATORY_CARE_PROVIDER_SITE_OTHER): Payer: Worker's Compensation | Admitting: Orthopaedic Surgery

## 2018-02-10 ENCOUNTER — Encounter (INDEPENDENT_AMBULATORY_CARE_PROVIDER_SITE_OTHER): Payer: Self-pay | Admitting: Orthopaedic Surgery

## 2018-02-10 VITALS — BP 104/56 | HR 79 | Ht 63.0 in | Wt 177.0 lb

## 2018-02-10 DIAGNOSIS — M25512 Pain in left shoulder: Secondary | ICD-10-CM

## 2018-02-10 NOTE — Progress Notes (Signed)
Office Visit Note/orthopedic consultation   Patient: Lindsey Edwards           Date of Birth: [birth year]           MRN: [id number] Visit Date: 02/10/2018              Requested by: Raliegh Ip, DO [address] [place], [place] [address] PCP: Salley Scarlet, MD   Assessment & Plan: Visit Diagnoses:  1. Acute pain of left shoulder     Plan: On-the-job injury 02/03/2018 with continued severe pain in her shoulder.  She is also having numbness from her elbow down all dermatomes.  I recommend proceeding with an MRI scan of her shoulder without contrast.  Have a superior labral tear the biceps anchor.  If MRI is negative then we can start some physical therapy.  Work slip given no work pending office visit follow-up after MRI scan.  10 use of the sling anti-inflammatory intermittent ice.  Thank you for the opportunity to see your consultation.  Follow-Up Instructions: No follow-ups on file.   Orders:  Orders Placed This Encounter  Procedures  . XR Shoulder Left   No orders of the defined types were placed in this encounter.     Procedures: No procedures performed   Clinical Data: No additional findings.   Subjective: Chief Complaint  Patient presents with  . 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  She was behind the patient pulling suddenly had sharp onset of left anterior shoulder pain.  She is left-hand-dominant she states she has not been able to move her shoulder since that time.  She is been in a sling taking pain medication Percocet and Mobic.  She denies past history of injury to her shoulder she is been active and active playing softball when she was younger.  Since had some associated left-sided neck pain also with her injury.  She states that she has numbness and tingling in her forearm all the way down to her fingertips all fingers of her left hand.  She denies any  gait disturbance.  Review of Systems 14 point review of systems positive for migraines ADHD history of insomnia.  PTSD.  Otherwise negative as it pertains HPI.  She did have some previous right knee arthroscopy 2011 tonsillectomy 96.   Objective: Vital Signs: BP (!) 104/56   Pulse 79   Ht  (1.6 m)   Wt 177 lb (80.3 kg)   BMI 31.35 kg/m   Physical Exam  Constitutional: She is oriented to person, place, and time. She appears well-developed.  HENT:  Head: Normocephalic.  Right Ear: External ear normal.  Left Ear: External ear normal.  Eyes: Pupils are equal, round, and reactive to light.  Neck: No tracheal deviation present. No thyromegaly present.  Cardiovascular: Normal rate.  Pulmonary/Chest: Effort normal.  Abdominal: Soft.  Neurological: She is alert and oriented to person, place, and time.  Skin: Skin is warm and dry.  Psychiatric: She has a normal mood and affect. Her behavior is normal.    Ortho Exam exquisite tenderness with palpation all around her shoulder.  She has extreme tenderness over the [facility] joint with AC joint effusion.  Long head of the biceps is tender posteriorly all along the medial border of the scapula mid body of the scapula.  Abduct to 40 degrees with sharp pain.  With arm at her side supraspinatus contracts with good resistance.  She has extreme pain with internal and external rotation of 40 degrees with her elbow at her side.  Yergason.  No distal migration of the biceps muscle no ecchymosis.  Tenderness over trapezius on the left side with palpation of the brachial plexus on the left side negative on the right side.  Compression no relief with distraction.  Upper extremity reflexes are 1+ and symmetrical.  Complains of decreased sensation from her elbow down to the fingertips entire forearm all dermatomes as well as all fingers.  EPL FPL extensors flexors are strong wrist extension and flexion takes good resistance.  Specialty Comments:  No specialty  comments available.  Imaging: No results found.   PMFS History: Patient Active Problem List   Diagnosis Date Noted  . PTSD (post-traumatic stress disorder) 08/23/2017  . Insomnia 03/23/2014  . Migraines 02/23/2014  . ADHD, predominantly hyperactive-impulsive subtype 02/23/2014  . Overweight 02/23/2014   Past Medical History:  Diagnosis Date  . ADHD (attention deficit hyperactivity disorder) 03/2004  . Migraine   . PTSD (post-traumatic stress disorder) 01/2010   dx from [facility]    Family History  Problem Relation Age of Onset  . Depression Mother   . Heart disease Mother   . Arthritis Maternal Grandmother   . Hypothyroidism Maternal Grandmother   . Mental illness Maternal Grandmother        OCD  . Hyperlipidemia Maternal Grandmother   . Heart disease Maternal Grandmother   . Cancer Maternal Grandfather   . Hearing loss Maternal Grandfather   . Hypertension Maternal Grandfather   . Asthma Paternal Grandfather     Past Surgical History:  Procedure Laterality Date  . ADENOIDECTOMY Bilateral 09/1994  . KNEE ARTHROSCOPY Right 05/2010   debridement  . TONSILLECTOMY Bilateral 04/1995   Social History   Occupational History  . Not on file  Tobacco Use  . Smoking status: Former Smoker    Last attempt to quit: 11/28/2013    Years since quitting: 4.2  . Smokeless tobacco: Former Neurosurgeon    Types: Snuff    Quit date: 02/21/2014  Substance and Sexual Activity  . Alcohol use: No  . Drug use: No  . Sexual activity: Yes    Birth control/protection: Condom, Pill

## 2018-03-08 ENCOUNTER — Telehealth (INDEPENDENT_AMBULATORY_CARE_PROVIDER_SITE_OTHER): Payer: Self-pay | Admitting: Orthopaedic Surgery

## 2018-03-08 NOTE — Telephone Encounter (Signed)
Patient called asking for the results of her MRI. CB # [phone]

## 2018-03-09 ENCOUNTER — Telehealth (INDEPENDENT_AMBULATORY_CARE_PROVIDER_SITE_OTHER): Payer: Self-pay | Admitting: Orthopaedic Surgery

## 2018-03-09 NOTE — Telephone Encounter (Signed)
[employer]. Ok for work note  That says OK for work. Ok to read her impression.

## 2018-03-09 NOTE — Telephone Encounter (Signed)
Patient called needing a call back concerning her MRI results. Patient also asked if she is cleared to return to work? Patient said she will need a work note in order to return. Patient stated she can not come in because her work comp was denied. The number to contact patient is [phone]

## 2018-03-09 NOTE — Telephone Encounter (Signed)
Please advise. Patient had MRI shoulder done at [facility]. Results are on your desk. Thanks.

## 2018-03-09 NOTE — Telephone Encounter (Signed)
I called for results from [facility]. They are being faxed to office and I will give to Dr. Ophelia Charter to review.

## 2018-03-10 NOTE — Telephone Encounter (Signed)
  I left voicemail advising. I explained to patient that Dr. Ophelia Charter states she can return to work and asked for return call in regards to date she is returning so that I can fix the note for her.

## 2018-03-10 NOTE — Telephone Encounter (Signed)
Patient called stating wants to return to work on Saturday, 03/19/18.  Please call patient to advise when note will be ready

## 2018-03-11 ENCOUNTER — Other Ambulatory Visit: Payer: Self-pay | Admitting: Family Medicine

## 2018-03-11 NOTE — Telephone Encounter (Signed)
Please send to Dr Ophelia CharterYates with orthopedics since this is WC

## 2018-03-11 NOTE — Telephone Encounter (Signed)
Note ready at front desk in [place] office to pick up. I left voicemail for patient advising.

## 2018-03-14 ENCOUNTER — Encounter (HOSPITAL_COMMUNITY): Payer: Self-pay | Admitting: Emergency Medicine

## 2018-03-14 ENCOUNTER — Emergency Department (HOSPITAL_COMMUNITY)
Admission: EM | Admit: 2018-03-14 | Discharge: 2018-03-14 | Disposition: A | Discharge status: Home / Self Care | Payer: Self-pay | Attending: Emergency Medicine | Admitting: Emergency Medicine

## 2018-03-14 DIAGNOSIS — Z87891 Personal history of nicotine dependence: Secondary | ICD-10-CM | POA: Insufficient documentation

## 2018-03-14 DIAGNOSIS — Z79899 Other long term (current) drug therapy: Secondary | ICD-10-CM | POA: Insufficient documentation

## 2018-03-14 DIAGNOSIS — F419 Anxiety disorder, unspecified: Secondary | ICD-10-CM | POA: Insufficient documentation

## 2018-03-14 DIAGNOSIS — J020 Streptococcal pharyngitis: Secondary | ICD-10-CM | POA: Insufficient documentation

## 2018-03-14 LAB — GROUP A STREP BY PCR: Group A Strep by PCR: DETECTED — AB

## 2018-03-14 MED ORDER — AZITHROMYCIN 250 MG PO TABS
500.0000 mg | ORAL_TABLET | Freq: Once | ORAL | Status: AC
  Administered 2018-03-14: 500 mg via ORAL
  Filled 2018-03-14: qty 2

## 2018-03-14 MED ORDER — ACETAMINOPHEN 500 MG PO TABS
1000.0000 mg | ORAL_TABLET | Freq: Once | ORAL | Status: AC
  Administered 2018-03-14: 1000 mg via ORAL
  Filled 2018-03-14: qty 2

## 2018-03-14 MED ORDER — AZITHROMYCIN 500 MG PO TABS: ORAL_TABLET | ORAL | 0 refills | Status: DC

## 2018-03-14 MED ORDER — MAGIC MOUTHWASH W/LIDOCAINE: 5.0000 mL | Freq: Three times a day (TID) | ORAL | 0 refills | Status: DC | PRN

## 2018-03-14 NOTE — ED Triage Notes (Signed)
Pt's sig other states she has been sick for about a week with a sore throat, fever, and generalized aches.  Pt did c/o difficulty breathing on arrival to ED and was hyperventilating.  Pt calmed down and this resolved.

## 2018-03-14 NOTE — Discharge Instructions (Addendum)
Alternate tylenol and ibuprofen every 4-6 hrs for fever and body aches.  It's important to drink plenty of fluids.  Start the antibiotic tomorrow and take as directed until its finished.  Follow-up with your doctor for recheck if needed.

## 2018-03-15 ENCOUNTER — Ambulatory Visit: Payer: Self-pay | Admitting: Family Medicine

## 2018-03-15 NOTE — ED Provider Notes (Signed)
[facility] EMERGENCY DEPARTMENT Provider Note   CSN: [id number] Arrival date & time: 03/14/18  1159     History   Chief Complaint Chief Complaint  Patient presents with  . Sore Throat  . Generalized Body Aches    HPI Lindsey Edwards is a 27 y.o. female.  HPI   Lindsey Edwards is a 27 y.o. female who presents to the Emergency Department complaining of generalized body aches, intermittent fever and sore throat.  States symptoms have been worsening for 1 week, but sore throat pain became severe 1 day ago.  She is tried over-the-counter sore throat medications without relief.  Pain worse with swallowing, states she is only able to drink small amounts of fluids.  She also complains of frontal headache.  No known sick contacts.  She is taken Tylenol without relief.  She denies neck pain or stiffness, visual changes, vomiting, abdominal pain, rash and diarrhea.    Past Medical History:  Diagnosis Date  . ADHD (attention deficit hyperactivity disorder) 03/2004  . Migraine   . PTSD (post-traumatic stress disorder) 01/2010   dx from [facility]    Patient Active Problem List   Diagnosis Date Noted  . PTSD (post-traumatic stress disorder) 08/23/2017  . Insomnia 03/23/2014  . Migraines 02/23/2014  . ADHD, predominantly hyperactive-impulsive subtype 02/23/2014  . Overweight 02/23/2014    Past Surgical History:  Procedure Laterality Date  . ADENOIDECTOMY Bilateral 09/1994  . KNEE ARTHROSCOPY Right 05/2010   debridement  . TONSILLECTOMY Bilateral 04/1995     OB History   None      Home Medications    Prior to Admission medications   Medication Sig Start Date End Date Taking? Authorizing Provider  azithromycin (ZITHROMAX) 500 MG tablet Take one tablet daily for 3 days. 03/14/18   Tj Kitchings, PA-C  dexmethylphenidate (FOCALIN XR) 15 MG 24 hr capsule Take 1 capsule (15 mg total) by mouth daily. 09/07/17   Salley Scarlet, MD  magic mouthwash w/lidocaine SOLN Take 5 mLs by  mouth 3 (three) times daily as needed for mouth pain. Swish and spit, do not swallow 03/14/18   Unika Nazareno, PA-C  Melatonin 10 MG TABS Take 1 tablet by mouth at bedtime.    [provider]  meloxicam (MOBIC) 15 MG tablet Take 0.5-1 tablets (7.5-15 mg total) by mouth daily. (prn pain) 02/04/18   Raliegh Ip, DO  methylphenidate (RITALIN) 10 MG tablet Take 1 tablet q PM PRN when Concerta has/ is wearing off. 08/23/17   [place], Velna Hatchet, MD  Norgestimate-Ethinyl Estradiol Triphasic (TRI-SPRINTEC) 0.18/0.215/0.25 MG-35 MCG tablet Take 1 tablet daily by mouth. 08/23/17   Wheeler, Velna Hatchet, MD  ondansetron (ZOFRAN) 8 MG tablet TAKE ONE TABLET BY MOUTH EVERY 8 HOURS AS NEEDED FOR NAUSEA (AT ONSET OF MIGRAINE) 03/11/16   Salley Scarlet, MD  oxyCODONE-acetaminophen (PERCOCET) 10-325 MG tablet Take 1 tablet by mouth every 8 (eight) hours as needed for pain. 02/04/18   Raliegh Ip, DO  rizatriptan (MAXALT-MLT) 10 MG disintegrating tablet Take 1 tablet (10 mg total) as needed by mouth for migraine. May repeat in 2 hours if needed 08/23/17   Salley Scarlet, MD  traZODone (DESYREL) 50 MG tablet Take 1 tablet (50 mg total) by mouth at bedtime. 01/10/18   Salley Scarlet, MD    Family History Family History  Problem Relation Age of Onset  . Depression Mother   . Heart disease Mother   . Arthritis Maternal Grandmother   .  Hypothyroidism Maternal Grandmother   . Mental illness Maternal Grandmother        OCD  . Hyperlipidemia Maternal Grandmother   . Heart disease Maternal Grandmother   . Cancer Maternal Grandfather   . Hearing loss Maternal Grandfather   . Hypertension Maternal Grandfather   . Asthma Paternal Grandfather     Social History Social History   Tobacco Use  . Smoking status: Former Smoker    Last attempt to quit: 11/28/2013    Years since quitting: 4.2  . Smokeless tobacco: Former NeurosurgeonUser    Types: Snuff    Quit date: 02/21/2014  Substance Use Topics  .  Alcohol use: No  . Drug use: No     Allergies   Keflex [cephalexin]; Penicillins; Septra [sulfamethoxazole-trimethoprim]; Sulfa antibiotics; and Pecan pollen   Review of Systems Review of Systems  Constitutional: Positive for appetite change and fever. Negative for activity change and chills.  HENT: Positive for congestion, sore throat and trouble swallowing. Negative for ear pain, facial swelling and voice change.   Eyes: Negative for pain and visual disturbance.  Respiratory: Negative for cough and shortness of breath.   Gastrointestinal: Negative for abdominal pain, nausea and vomiting.  Genitourinary: Negative for dysuria and flank pain.  Musculoskeletal: Negative for arthralgias, neck pain and neck stiffness.  Skin: Negative for color change and rash.  Neurological: Negative for dizziness, facial asymmetry, speech difficulty, numbness and headaches.  Hematological: Negative for adenopathy.  All other systems reviewed and are negative.    Physical Exam Updated Vital Signs BP 98/62 (BP Location: Left Arm)   Pulse (!) 104   Temp (!) 102 F (38.9 C) (Oral)   Resp 18   Ht 5\' 3"  (1.6 m)   Wt 78 kg (172 lb)   LMP 02/14/2018   SpO2 98%   BMI 30.47 kg/m   Physical Exam  Constitutional: She is oriented to person, place, and time. She appears well-developed and well-nourished.  Patient tearful, waxing and waning between whispering to me and speaking in a normal voice.  HENT:  Right Ear: Tympanic membrane and ear canal normal.  Left Ear: Tympanic membrane and ear canal normal.  Mouth/Throat: Uvula is midline and mucous membranes are normal. No oral lesions. No trismus in the jaw. No uvula swelling. Posterior oropharyngeal edema and posterior oropharyngeal erythema present. No oropharyngeal exudate or tonsillar abscesses.  Airway patent.  Uvula is midline and nonedematous.  No retropharyngeal abnormalities seen  Neck: Normal range of motion.  Cardiovascular: Normal rate and  regular rhythm.  No murmur heard. Pulmonary/Chest: Effort normal and breath sounds normal. No respiratory distress.  Abdominal: Soft. Normal appearance. She exhibits no distension. There is no splenomegaly. There is no tenderness.  Musculoskeletal: Normal range of motion.  Lymphadenopathy:    She has cervical adenopathy.  Neurological: She is alert and oriented to person, place, and time.  Skin: Skin is warm. No rash noted.  Psychiatric: Thought content normal. Her mood appears anxious.  Nursing note and vitals reviewed.    ED Treatments / Results  Labs (all labs ordered are listed, but only abnormal results are displayed) Labs Reviewed  GROUP A STREP BY PCR - Abnormal; Notable for the following components:      Result Value   Group A Strep by PCR DETECTED (*)    All other components within normal limits    EKG None  Radiology No results found.  Procedures Procedures (including critical care time)  Medications Ordered in ED Medications  azithromycin (  ZITHROMAX) tablet 500 mg (500 mg Oral Given 03/14/18 1353)  acetaminophen (TYLENOL) tablet 1,000 mg (1,000 mg Oral Given 03/14/18 1425)     Initial Impression / Assessment and Plan / ED Course  I have reviewed the triage vital signs and the nursing notes.  Pertinent labs & imaging results that were available during my care of the patient were reviewed by me and considered in my medical decision making (see chart for details).     Vitals reviewed.  Strep positive.  Abdomen is soft and nontender.  Patient has significant allergies to penicillin and cephalosporins, so I will treat with macrolide and Magic mouthwash.  Patient is somewhat dramatic regarding her symptoms, but I do not feel that clinically she is dehydrated.  She agrees to treatment plan and Tylenol and ibuprofen for body aches and fever relief.  She agrees to PCP follow-up and return precautions discussed.  Final Clinical Impressions(s) / ED Diagnoses   Final  diagnoses:  Strep pharyngitis    ED Discharge Orders        Ordered    azithromycin (ZITHROMAX) 500 MG tablet     03/14/18 1357    magic mouthwash w/lidocaine SOLN  3 times daily PRN     03/14/18 1357       Mamie Hundertmark, [place], PA-C 03/15/18 0932    Loren Racer, MD 03/15/18 [phone]

## 2018-03-18 ENCOUNTER — Encounter: Payer: Self-pay | Admitting: Family Medicine

## 2018-03-18 ENCOUNTER — Ambulatory Visit: Payer: Worker's Compensation | Admitting: Family Medicine

## 2018-03-18 ENCOUNTER — Other Ambulatory Visit: Payer: Self-pay

## 2018-03-18 VITALS — BP 120/72 | HR 77 | Temp 98.3°F | Resp 15 | Ht 62.0 in | Wt 178.2 lb

## 2018-03-18 DIAGNOSIS — J0140 Acute pansinusitis, unspecified: Secondary | ICD-10-CM

## 2018-03-18 DIAGNOSIS — J209 Acute bronchitis, unspecified: Secondary | ICD-10-CM

## 2018-03-18 DIAGNOSIS — J340 Abscess, furuncle and carbuncle of nose: Secondary | ICD-10-CM

## 2018-03-18 MED ORDER — BENZONATATE 100 MG PO CAPS: 100.0000 mg | ORAL_CAPSULE | Freq: Three times a day (TID) | ORAL | 0 refills | Status: DC | PRN

## 2018-03-18 MED ORDER — PREDNISONE 20 MG PO TABS: 40.0000 mg | ORAL_TABLET | Freq: Every day | ORAL | 0 refills | Status: AC

## 2018-03-18 MED ORDER — MUPIROCIN 2 % EX OINT: 1.0000 "application " | TOPICAL_OINTMENT | Freq: Two times a day (BID) | CUTANEOUS | 0 refills | Status: DC

## 2018-03-18 MED ORDER — ALBUTEROL SULFATE HFA 108 (90 BASE) MCG/ACT IN AERS: 2.0000 | INHALATION_SPRAY | RESPIRATORY_TRACT | 0 refills | Status: DC | PRN

## 2018-03-18 MED ORDER — DOXYCYCLINE HYCLATE 100 MG PO TABS: 100.0000 mg | ORAL_TABLET | Freq: Two times a day (BID) | ORAL | 0 refills | Status: AC

## 2018-03-18 NOTE — Progress Notes (Signed)
Patient ID: Lindsey Edwards, female    DOB: 10/17/1990, 27 y.o.   MRN: [id number]  PCP: Salley Scarleturham, Kawanta F, MD  Chief Complaint  Patient presents with  . Rash    Rash in nose and underneath nostril    Subjective:   Lindsey Edwards is a 27 y.o. female, presents to clinic with CC of rash and tenderness to her left nare, onset 3 days ago, area is crusted with surrounding red skin, inside of her nose is very tender and swollen.  Her symptoms began 1 day after being seen in the ER and treated for strep pharyngitis, treated with azithromycin.  She endorses also having sinus infection and cough.  Her severe sore throat has improved this week with the Z-Pak, but her rash to her nose, nasal congestion, sinus pain and pressure, postnasal drip and productive cough have been constant and fairly severe.  She states that whenever she lies back beyond 45 degrees that she has ample drainage which triggers severe coughing fits.  She endorses shortness of breath and wheeze with the coughing fits.  When at rest or upright she does not have any chest pain, shortness of breath, and she is not getting dyspneic on exertion.  Her fever resolved 3 days ago, she does not have any other fatigue, sweats, rash, body aches.  She has a history of seasonal allergies, usually in the fall months, has history of "chronic bronchitis as a child" and getting hayfever often.     Patient Active Problem List   Diagnosis Date Noted  . PTSD (post-traumatic stress disorder) 08/23/2017  . Insomnia 03/23/2014  . Migraines 02/23/2014  . ADHD, predominantly hyperactive-impulsive subtype 02/23/2014  . Overweight 02/23/2014     Prior to Admission medications   Medication Sig Start Date End Date Taking? Authorizing Provider  dexmethylphenidate (FOCALIN XR) 15 MG 24 hr capsule Take 1 capsule (15 mg total) by mouth daily. 09/07/17  Yes Casper, Velna HatchetKawanta F, MD  magic mouthwash w/lidocaine SOLN Take 5 mLs by mouth 3 (three) times daily as needed  for mouth pain. Swish and spit, do not swallow 03/14/18  Yes Triplett, Tammy, PA-C  Melatonin 10 MG TABS Take 1 tablet by mouth at bedtime.   Yes [provider]  meloxicam (MOBIC) 15 MG tablet Take 0.5-1 tablets (7.5-15 mg total) by mouth daily. (prn pain) 02/04/18  Yes Delynn FlavinGottschalk, Ashly M, DO  methylphenidate (RITALIN) 10 MG tablet Take 1 tablet q PM PRN when Concerta has/ is wearing off. 08/23/17  Yes Shields, Velna HatchetKawanta F, MD  Norgestimate-Ethinyl Estradiol Triphasic (TRI-SPRINTEC) 0.18/0.215/0.25 MG-35 MCG tablet Take 1 tablet daily by mouth. 08/23/17  Yes Terril, Velna HatchetKawanta F, MD  ondansetron (ZOFRAN) 8 MG tablet TAKE ONE TABLET BY MOUTH EVERY 8 HOURS AS NEEDED FOR NAUSEA (AT ONSET OF MIGRAINE) 03/11/16  Yes [place], Velna HatchetKawanta F, MD  rizatriptan (MAXALT-MLT) 10 MG disintegrating tablet Take 1 tablet (10 mg total) as needed by mouth for migraine. May repeat in 2 hours if needed 08/23/17  Yes [place], Velna HatchetKawanta F, MD  traZODone (DESYREL) 50 MG tablet Take 1 tablet (50 mg total) by mouth at bedtime. 01/10/18  Yes [place]land, Velna HatchetKawanta F, MD     Allergies  Allergen Reactions  . Keflex [Cephalexin] Anaphylaxis  . Penicillins Anaphylaxis  . Septra [Sulfamethoxazole-Trimethoprim] Anaphylaxis  . Sulfa Antibiotics Anaphylaxis  . Pecan Pollen Hives     Family History  Problem Relation Age of Onset  . Depression Mother   . Heart disease Mother   . Arthritis  Maternal Grandmother   . Hypothyroidism Maternal Grandmother   . Mental illness Maternal Grandmother        OCD  . Hyperlipidemia Maternal Grandmother   . Heart disease Maternal Grandmother   . Cancer Maternal Grandfather   . Hearing loss Maternal Grandfather   . Hypertension Maternal Grandfather   . Asthma Paternal Grandfather      Social History   Socioeconomic History  . Marital status: Single    Spouse name: Not on file  . Number of children: Not on file  . Years of education: Not on file  . Highest education level: Not on file    Occupational History  . Not on file  Social Needs  . Financial resource strain: Not on file  . Food insecurity:    Worry: Not on file    Inability: Not on file  . Transportation needs:    Medical: Not on file    Non-medical: Not on file  Tobacco Use  . Smoking status: Former Smoker    Last attempt to quit: 11/28/2013    Years since quitting: 4.3  . Smokeless tobacco: Former Neurosurgeon    Types: Snuff    Quit date: 02/21/2014  Substance and Sexual Activity  . Alcohol use: No  . Drug use: No  . Sexual activity: Yes    Birth control/protection: Condom, Pill  Lifestyle  . Physical activity:    Days per week: Not on file    Minutes per session: Not on file  . Stress: Not on file  Relationships  . Social connections:    Talks on phone: Not on file    Gets together: Not on file    Attends religious service: Not on file    Active member of club or organization: Not on file    Attends meetings of clubs or organizations: Not on file    Relationship status: Not on file  . Intimate partner violence:    Fear of current or ex partner: Not on file    Emotionally abused: Not on file    Physically abused: Not on file    Forced sexual activity: Not on file  Other Topics Concern  . Not on file  Social History Narrative  . Not on file     Review of Systems  All other systems reviewed and are negative.      Objective:    Vitals:   03/18/18 1231  BP: 120/72  Pulse: 77  Resp: 15  Temp: 98.3 F (36.8 C)  TempSrc: Oral  SpO2: 97%  Weight: 178 lb 4 oz (80.9 kg)  Height: 5\' 2"  (1.575 m)      Physical Exam  Constitutional: She is oriented to person, place, and time. She appears well-developed and well-nourished.  Non-toxic appearance. No distress.  HENT:  Head: Normocephalic and atraumatic.  Right Ear: External ear normal.  Left Ear: External ear normal.  Mouth/Throat: Uvula is midline, oropharynx is clear and moist and mucous membranes are normal. No oropharyngeal exudate.   Nasal mucosa edematous and erythematous, scabbing and crusting to left nare rocks imminently 1 x 1 cm area the opening of nose with erythematous base, scabbing extends into left nostril, tender to palpation, no surrounding edema or induration Diffuse sinus tenderness to palpation Postnasal drip  Eyes: Pupils are equal, round, and reactive to light. Conjunctivae, EOM and lids are normal. Right eye exhibits no discharge. Left eye exhibits no discharge. No scleral icterus.  Neck: Normal range of motion and phonation normal.  Neck supple. No tracheal deviation present.  Cardiovascular: Normal rate, regular rhythm, normal heart sounds, intact distal pulses and normal pulses. Exam reveals no gallop and no friction rub.  No murmur heard. Pulses:      Radial pulses are 2+ on the right side, and 2+ on the left side.       Posterior tibial pulses are 2+ on the right side, and 2+ on the left side.  Pulmonary/Chest: Effort normal. No stridor. No respiratory distress. She has wheezes. She has no rhonchi. She has no rales. She exhibits no tenderness.  Frequent cough  Abdominal: Soft. Normal appearance and bowel sounds are normal. She exhibits no distension. There is no tenderness. There is no rebound and no guarding.  Musculoskeletal: Normal range of motion. She exhibits no edema or deformity.  Lymphadenopathy:    She has no cervical adenopathy.  Neurological: She is alert and oriented to person, place, and time. She exhibits normal muscle tone. Coordination and gait normal.  Skin: Skin is warm, dry and intact. Capillary refill takes less than 2 seconds. No rash noted. She is not diaphoretic. No pallor.  Psychiatric: She has a normal mood and affect. Her speech is normal and behavior is normal.          Assessment & Plan:      ICD-10-CM   1. Cellulitis of nose J34.0 doxycycline (VIBRA-TABS) 100 MG tablet    mupirocin ointment (BACTROBAN) 2 %  2. Acute non-recurrent pansinusitis J01.40 doxycycline  (VIBRA-TABS) 100 MG tablet  3. Bronchospasm with bronchitis, acute J20.9 albuterol (PROVENTIL HFA;VENTOLIN HFA) 108 (90 Base) MCG/ACT inhaler    predniSONE (DELTASONE) 20 MG tablet    benzonatate (TESSALON) 100 MG capsule    Patient with skin sores to her nose following treatment of strep pharyngitis with Z-Pak.  At the time of diagnosis of her strep throat she also endorsed URI symptoms and cough.  Her sore throat has improved everything else is unchanged.  She developed skin sores to her nose, very tender inside her nostril, she continues to be extremely congested with sinus pain and pressure.  We will cover for impetigo, and soft tissue infection to her nose with topical mupirocin to affected areas, and will treat pansinusitis with doxycycline, which will also give good gram-positive coverage.  We will treat bronchospasm and wheeze with prednisone burst, inhalers and cough suppressants  Patient encouraged to follow-up if not improving in 5 to 7 days.     Danelle Berry, PA-C 03/18/18 12:50 PM

## 2018-03-18 NOTE — Patient Instructions (Signed)
Would return for recheck if nasal rash is not improving, other viral tests may be needed.  Start treating allergies

## 2018-04-27 ENCOUNTER — Telehealth (INDEPENDENT_AMBULATORY_CARE_PROVIDER_SITE_OTHER): Payer: Self-pay | Admitting: Orthopaedic Surgery

## 2018-04-27 NOTE — Telephone Encounter (Signed)
Returned call to patient left message to call back concerning question about her MRI results. [phone]

## 2018-04-27 NOTE — Telephone Encounter (Signed)
I called discussed. She will be calling to make appt to review new MRI.

## 2018-04-27 NOTE — Telephone Encounter (Signed)
Did you try to call patient?  Or know who might have?

## 2018-04-27 NOTE — Telephone Encounter (Signed)
IC LMVM that I am not sure who called her.  LM for her to call me back and I will try to figure it out.

## 2018-04-27 NOTE — Telephone Encounter (Signed)
Patient called wanting to discuss MRI. Stated someone called her. Advised Dr.Yates was in surgery.  Please cal patient back as she was adamant about speaking to someone.

## 2018-04-27 NOTE — Telephone Encounter (Signed)
Patient called me back, she is just wanting to speak with someone about her MRI results.  Can you call her to discuss what MRI showed?

## 2018-04-28 ENCOUNTER — Ambulatory Visit (INDEPENDENT_AMBULATORY_CARE_PROVIDER_SITE_OTHER): Payer: Worker's Compensation | Admitting: Orthopaedic Surgery

## 2018-04-28 ENCOUNTER — Encounter (INDEPENDENT_AMBULATORY_CARE_PROVIDER_SITE_OTHER): Payer: Self-pay | Admitting: Orthopaedic Surgery

## 2018-04-28 VITALS — BP 122/60 | HR 68 | Ht 62.0 in | Wt 178.0 lb

## 2018-04-28 DIAGNOSIS — M25512 Pain in left shoulder: Secondary | ICD-10-CM

## 2018-04-28 NOTE — Progress Notes (Signed)
Office Visit Note   Patient: Lindsey RuddyJessica Crance           Date of Birth: 08/08/1991           MRN: [id number] Visit Date: 04/28/2018              Requested by: Salley Scarleturham, Kawanta F, MD [address] [place], [place] [id number] PCP: Salley Scarleturham, Kawanta F, MD   Assessment & Plan: Visit Diagnoses:  1. Left shoulder pain, unspecified chronicity     Plan: Patient does have some rotator cuff tendinopathy by report without focal tear.  Changes on her labrum may represent labral pathology with tear versus possible normal variant.  Subacromial injection was performed for positive impingement.  We may consider an anterior injection around the biceps tendon at a later date.  All of her jobs have been very physically active.  If we cannot settle down her symptoms she may require a arthrogram MRI for better evaluation of her superior labrum.  She will obtain a copy of her MRI on disc and bring it back on follow-up visit for review.  Unfortunately we are not able to view it online.  Work slip given for no work x1 week and she will return next week with her disc for review.  Follow-Up Instructions: No follow-ups on file.   Orders:  Orders Placed This Encounter  Procedures  . Large Joint Inj: L subacromial bursa   No orders of the defined types were placed in this encounter.     Procedures: Large Joint Inj: L subacromial bursa on 04/28/2018 11:33 AM Indications: pain Details: 22 G 1.5 in needle  Arthrogram: No  Medications: 4 mL bupivacaine 0.25 %; 40 mg methylPREDNISolone acetate 40 MG/ML; 0.5 mL lidocaine 1 % Outcome: tolerated well, no immediate complications Procedure, treatment alternatives, risks and benefits explained, specific risks discussed. Consent was given by the patient. Immediately prior to procedure a time out was called to verify the correct patient, procedure, equipment, support staff and site/side marked as required. Patient was prepped and draped in the usual sterile fashion.        Clinical Data: No additional findings.   Subjective: Chief Complaint  Patient presents with  . Left Shoulder - Follow-up    HPI 27 year old female returns for follow-up after on-the-job injury reported on 02/03/2018 with persistent pain in her left shoulder.  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  She states she has pain that radiates down to her elbow in all dermatomes.  Pain with outstretched reaching and anterior shoulder pain.  Discomfort with lifting.  In the past she was in the [employer], worked as a IT sales professionalfirefighter and now EMS.  MRI scan has been obtained for ongoing problems with her shoulder report is available for review but she does not have the disc.  MRI of the left shoulder shows mild tendinosis and fraying of the rotator cuff without focal tear.  There is suboptimal visualization due to nondistended joint on the anterior aspect of the labrum with mild blunting of the anterior and posterior labrum with apparent grossly intact superior labrum.  This was read as possible normal variant of a sub-labral foramina anterior superior with moderate blunting of the labrum.  Review of Systems 14 point review of systems updated unchanged from 02/10/2018.  Of note is history of migraines, ADHD, history of insomnia, PTSD.  Previous right knee arthroscopy previous tonsillectomy.  Objective: Vital Signs: BP 122/60   Pulse 68   Ht 5\' 2"  (1.575 m)   Wt 178 lb (80.7 kg)   BMI 32.56 kg/m   Physical Exam  Constitutional: She is oriented to person, place, and time. She appears well-developed.  HENT:  Head: Normocephalic.  Right Ear: External ear normal.  Left Ear: External ear normal.  Eyes: Pupils are equal, round, and reactive to light.  Neck: No tracheal deviation present. No thyromegaly present.  Cardiovascular: Normal rate.  Pulmonary/Chest: Effort normal.  Abdominal: Soft.  Neurological: She is alert  and oriented to person, place, and time.  Skin: Skin is warm and dry.  Psychiatric: She has a normal mood and affect. Her behavior is normal.    Ortho Exam patient complains of significant pain with palpation about her shoulder over the deltoid over the posterior joint as well as anteriorly over the long head of the biceps.  She has tenderness over the acromioclavicular joint does not seem to get worse with crossarm abduction test.  No subluxation long head of the biceps tendon.  She complains of anterior shoulder pain with resisted biceps testing with her elbow at her side.  Upper extremity reflexes are 1+ and symmetrical.  She complains of pain over the deltoid and posterior shoulder with palpation along the left side of her neck.  Triceps testing is normal.  Finger flexion extension grip is normal.  No atrophy.  Specialty Comments:  No specialty comments available.  Imaging: No results found.   PMFS History: Patient Active Problem List   Diagnosis Date Noted  . PTSD (post-traumatic stress disorder) 08/23/2017  . Insomnia 03/23/2014  . Migraines 02/23/2014  . ADHD, predominantly hyperactive-impulsive subtype 02/23/2014  . Overweight 02/23/2014   Past Medical History:  Diagnosis Date  . ADHD (attention deficit hyperactivity disorder) 03/2004  . Migraine   . PTSD (post-traumatic stress disorder) 01/2010   [facility]    Family History  Problem Relation Age of Onset  . Depression Mother   . Heart disease Mother   . Arthritis Maternal Grandmother   . Hypothyroidism Maternal Grandmother   . Mental illness Maternal Grandmother        OCD  . Hyperlipidemia Maternal Grandmother   . Heart disease Maternal Grandmother   . Cancer Maternal Grandfather   . Hearing loss Maternal Grandfather   . Hypertension Maternal Grandfather   . Asthma Paternal Grandfather     Past Surgical History:  Procedure Laterality Date  . ADENOIDECTOMY Bilateral 09/1994  . KNEE ARTHROSCOPY Right  05/2010   debridement  . TONSILLECTOMY Bilateral 04/1995   Social History   Occupational History  . Not on file  Tobacco Use  . Smoking status: Former Smoker    Last attempt to quit: 11/28/2013    Years since quitting: 4.4  . Smokeless tobacco: Former Neurosurgeon    Types: Snuff    Quit date: 02/21/2014  Substance and Sexual Activity  . Alcohol use: No  . Drug use: No  . Sexual activity: Yes    Birth control/protection: Condom, Pill

## 2018-05-01 ENCOUNTER — Encounter (INDEPENDENT_AMBULATORY_CARE_PROVIDER_SITE_OTHER): Payer: Self-pay | Admitting: Orthopaedic Surgery

## 2018-05-01 MED ORDER — METHYLPREDNISOLONE ACETATE 40 MG/ML IJ SUSP
40.0000 mg | INTRAMUSCULAR | Status: AC | PRN
Start: 2018-04-28 — End: 2018-04-28
  Administered 2018-04-28: 40 mg via INTRA_ARTICULAR

## 2018-05-01 MED ORDER — LIDOCAINE HCL 1 % IJ SOLN
0.5000 mL | INTRAMUSCULAR | Status: AC | PRN
Start: 2018-04-28 — End: 2018-04-28
  Administered 2018-04-28: .5 mL

## 2018-05-01 MED ORDER — BUPIVACAINE HCL 0.25 % IJ SOLN
4.0000 mL | INTRAMUSCULAR | Status: AC | PRN
  Administered 2018-04-28: 4 mL via INTRA_ARTICULAR

## 2018-05-05 ENCOUNTER — Ambulatory Visit (INDEPENDENT_AMBULATORY_CARE_PROVIDER_SITE_OTHER): Payer: Worker's Compensation | Admitting: Orthopaedic Surgery

## 2018-05-05 ENCOUNTER — Encounter (INDEPENDENT_AMBULATORY_CARE_PROVIDER_SITE_OTHER): Payer: Self-pay | Admitting: Orthopaedic Surgery

## 2018-05-05 VITALS — BP 125/87 | HR 80 | Ht 62.0 in | Wt 178.0 lb

## 2018-05-05 DIAGNOSIS — S43432D Superior glenoid labrum lesion of left shoulder, subsequent encounter: Secondary | ICD-10-CM | POA: Diagnosis not present

## 2018-05-05 MED ORDER — MELOXICAM 15 MG PO TABS: 15.0000 mg | ORAL_TABLET | Freq: Every day | ORAL | 2 refills | Status: DC

## 2018-05-05 NOTE — Progress Notes (Signed)
Office Visit Note   Patient: Lindsey RuddyJessica Edwards           Date of Birth: 04/12/1991           MRN: [id number] Visit Date: 05/05/2018              Requested by: Salley Scarleturham, Kawanta F, MD [address] [place], [place] [id number] PCP: Salley Scarleturham, Kawanta F, MD   Assessment & Plan: Visit Diagnoses:  1. Labral tear of shoulder, left, subsequent encounter     Plan: I recommend patient have an arthrogram MRI scan for definite diagnosis of her superior labral tear.  Patient was sent devious MRI but it was not a 1.5 Tesla magnet and image report suboptimal visualization due to nondistended joint on the anterior aspect of the labrum with mild blunting of the anterior posterior labrum possible normal variant with sub-labral foramen anteriorly.  I reviewed the images with the patient it appears frayed on the undersurface of the labrum I think she has a tear which would be consistent with her symptoms on serial exam.  Patient needs an arthrogram MRI scan to definitely diagnose the superior labral tear and his ongoing symptoms likely will require shoulder arthroscopy with SLAP repair.  MRI needs to be performed with a 1.5 Tesla magnet.  Work slip given for no work pending office follow-up after MRI scan with arthrogram is obtained to evaluate her for a superior labral tear.  Follow-Up Instructions: Return after shoulder MRI scan.  Orders:  No orders of the defined types were placed in this encounter.  Meds ordered this encounter  Medications  . meloxicam (MOBIC) 15 MG tablet    Sig: Take 1 tablet (15 mg total) by mouth daily.    Dispense:  30 tablet    Refill:  2      Procedures: No procedures performed   Clinical Data: No additional findings.   Subjective: Chief Complaint  Patient presents with  . Left Shoulder - Pain, Follow-up    MRI review    HPI 27 year old female returns with ongoing problems with her left shoulder.  She had subacromial injection performed after MRI showed some  tendinopathy and there is effect on the superior labrum visualized on the MRI scan disc which she brought today we reviewed the images and it appears that she has a superior labral tear.  She states the injection helped for 4 hours and then pain began severe and she states she was crying to use Tylenol she is noticed some improvement with Mobic and requested a refill which I sent in for her 15 mg 1 p.o. daily.  She has pain with outstretched reaching.  She took a day and did nothing and states as soon as she used her arm either getting dressed outstretched reaching or trying to pick up a 27-year-old she had sharp anterior pain and she points directly over the bicipital groove of the left shoulder.  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  No past history of injury to her shoulder prior to this injury.  She is had persistent symptoms since that time.  Currently she is out of work this week due to significant increase in her pain.  Review of Systems 14  point review of systems updated unchanged from 02/10/2018 office visit other than as mentioned in HPI.   Objective: Vital Signs: BP 125/87  Pulse 80   Ht 5\' 2"  (1.575 m)   Wt 178 lb (80.7 kg)   BMI 32.56 kg/m   Physical Exam  Constitutional: She is oriented to person, place, and time. She appears well-developed.  HENT:  Head: Normocephalic.  Right Ear: External ear normal.  Left Ear: External ear normal.  Eyes: Pupils are equal, round, and reactive to light.  Neck: No tracheal deviation present. No thyromegaly present.  Cardiovascular: Normal rate.  Pulmonary/Chest: Effort normal.  Abdominal: Soft.  Neurological: She is alert and oriented to person, place, and time.  Skin: Skin is warm and dry.  Psychiatric: She has a normal mood and affect. Her behavior is normal.    Ortho Exam patient has good cervical range of motion  minimal brachial plexus tenderness.  Some tenderness of the trapezius.  No atrophy of the deltoid.  Negative subluxation of the left shoulder.  She has pain with biceps resisted testing and points directly over the bicipital groove.  Negative Yergason test.  Negative drop arm test.  Reflexes upper extremities are 2+ and symmetrical sensation her hand is normal good grip strength.  Normal heel toe gait.  Opposite right shoulder shows no tenderness over the biceps tendon negative impingement right shoulder.  Specialty Comments:  No specialty comments available.  Imaging: No results found.   PMFS History: Patient Active Problem List   Diagnosis Date Noted  . PTSD (post-traumatic stress disorder) 08/23/2017  . Insomnia 03/23/2014  . Migraines 02/23/2014  . ADHD, predominantly hyperactive-impulsive subtype 02/23/2014  . Overweight 02/23/2014   Past Medical History:  Diagnosis Date  . ADHD (attention deficit hyperactivity disorder) 03/2004  . Migraine   . PTSD (post-traumatic stress disorder) 01/2010   [facility]    Family History  Problem Relation Age of Onset  . Depression Mother   . Heart disease Mother   . Arthritis Maternal Grandmother   . Hypothyroidism Maternal Grandmother   . Mental illness Maternal Grandmother        OCD  . Hyperlipidemia Maternal Grandmother   . Heart disease Maternal Grandmother   . Cancer Maternal Grandfather   . Hearing loss Maternal Grandfather   . Hypertension Maternal Grandfather   . Asthma Paternal Grandfather     Past Surgical History:  Procedure Laterality Date  . ADENOIDECTOMY Bilateral 09/1994  . KNEE ARTHROSCOPY Right 05/2010   debridement  . TONSILLECTOMY Bilateral 04/1995   Social History   Occupational History  . Not on file  Tobacco Use  . Smoking status: Former Smoker    Last attempt to quit: 11/28/2013    Years since quitting: 4.4  . Smokeless tobacco: Former Neurosurgeon    Types: Snuff    Quit date: 02/21/2014  Substance  and Sexual Activity  . Alcohol use: No  . Drug use: No  . Sexual activity: Yes    Birth control/protection: Condom, Pill

## 2018-05-06 ENCOUNTER — Encounter (INDEPENDENT_AMBULATORY_CARE_PROVIDER_SITE_OTHER): Payer: Self-pay | Admitting: Orthopaedic Surgery

## 2018-05-10 ENCOUNTER — Telehealth (INDEPENDENT_AMBULATORY_CARE_PROVIDER_SITE_OTHER): Payer: Self-pay

## 2018-05-10 NOTE — Telephone Encounter (Signed)
OK for note. thanks

## 2018-05-10 NOTE — Telephone Encounter (Signed)
Spoke with pt about her WC denial and let her know that she needs to contact industrial commission to start appeal process. She is asking for her last office note to be accessible  on my chart. Is this ok?

## 2018-05-11 NOTE — Telephone Encounter (Signed)
From what I can see this note has been shared with the patient.  If she cannot access it, I would see about faxing it or emailing it to her?  Otherwise she can call mychart support.

## 2018-05-16 ENCOUNTER — Telehealth (INDEPENDENT_AMBULATORY_CARE_PROVIDER_SITE_OTHER): Payer: Self-pay | Admitting: Orthopaedic Surgery

## 2018-05-16 NOTE — Telephone Encounter (Signed)
Received VM from patient. IC called her back and LMVM stating if she is wanting to get copy of records we wound need signed authorization

## 2018-05-27 NOTE — Telephone Encounter (Signed)
Patient came into office and signed paperwork. She also brought form to be completed which was a Work Automotive engineerAccomodation request. She states that she is working to get employment with [employer] and has told them about her shoulder injury. She needs paperwork completed so that they can make accomodations for her shoulder. Paperwork completed per Dr. Ophelia CharterYates instructions and faxed to [facility] [employer] at [phone] per patient request.

## 2018-05-30 ENCOUNTER — Telehealth (INDEPENDENT_AMBULATORY_CARE_PROVIDER_SITE_OTHER): Payer: Self-pay | Admitting: Orthopaedic Surgery

## 2018-05-30 ENCOUNTER — Encounter (INDEPENDENT_AMBULATORY_CARE_PROVIDER_SITE_OTHER): Payer: Self-pay | Admitting: Radiology

## 2018-05-30 NOTE — Telephone Encounter (Signed)
Can you please advise?

## 2018-05-30 NOTE — Telephone Encounter (Signed)
Note faxed per patient request

## 2018-05-30 NOTE — Telephone Encounter (Signed)
I left voicemail for Lindsey Edwards advising. 

## 2018-05-30 NOTE — Telephone Encounter (Signed)
[facility] [employer] called and wanted to know how long the restrictions are going to last and what the maximum weight she can lift.  CB#[phone] and fax: [phone].  Thank you

## 2018-05-30 NOTE — Telephone Encounter (Signed)
She is pending arthrogram MRI and likely will need surgery and post op rehab to correct problem then she should be able to lift. Until then she states she cannot lift outstretched or overhead. ucall thanks

## 2018-05-30 NOTE — Telephone Encounter (Signed)
Patient would like the letter from 03/11/18 faxed to attn Wendall MolaKenzie Huber # [phone]

## 2018-05-31 NOTE — Telephone Encounter (Signed)
Lindsey Sheldonshley returned call and would like note faxed to her stating patient unable to get MRI at this time due to patient's request. She also would like the note to state the accomodations to work patient will have to follow.   Fax to AttnMorrie Edwards: Ashley at [phone]

## 2018-06-01 NOTE — Telephone Encounter (Signed)
Note faxed to Ashley. 

## 2018-06-18 ENCOUNTER — Other Ambulatory Visit: Payer: Self-pay | Admitting: Family Medicine

## 2018-06-18 DIAGNOSIS — J209 Acute bronchitis, unspecified: Secondary | ICD-10-CM

## 2018-06-20 NOTE — Telephone Encounter (Signed)
Ok to refill 

## 2018-06-21 ENCOUNTER — Telehealth (INDEPENDENT_AMBULATORY_CARE_PROVIDER_SITE_OTHER): Payer: Self-pay | Admitting: Orthopaedic Surgery

## 2018-06-21 NOTE — Telephone Encounter (Signed)
No prescription meds for new illnesses.  Pt can do over the counter or come in for visit.   My policy to do history and physical before rx for new sx or new acute illness

## 2018-06-21 NOTE — Telephone Encounter (Signed)
Patient called advised her new employer need clarification on an restrictions for lifting. Patient works for [employer]. The fax# is [phone]  Attn: Erik Obey   The number to contact patient is [phone]

## 2018-06-21 NOTE — Telephone Encounter (Signed)
Returned call to patient left message to return call .  [phone]

## 2018-06-21 NOTE — Telephone Encounter (Signed)
Last note in chart detailing restrictions faxed.

## 2018-07-08 ENCOUNTER — Telehealth (INDEPENDENT_AMBULATORY_CARE_PROVIDER_SITE_OTHER): Payer: Self-pay | Admitting: Orthopaedic Surgery

## 2018-07-08 DIAGNOSIS — S43432D Superior glenoid labrum lesion of left shoulder, subsequent encounter: Secondary | ICD-10-CM

## 2018-07-08 NOTE — Telephone Encounter (Signed)
Patient states that she is now being covered by [employer] for up to $50,000 for medical expenses.  Claim #[id number], Policy # [id number]. Phone [phone] and fax is [phone].  Attn:: Tiburcio Pea, Sr Claims Adjustor.  Note needs to be faxed here as well.

## 2018-07-08 NOTE — Telephone Encounter (Signed)
Patient was denied by [facility] for shoulder injury. You had ordered a MRI Arthrogram, but patient did not want to proceed at that time due to not being able to afford it. 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 We had faxed notes in regards to limitations with that arm/shoulder to [facility] per patient request.    Please advise on continued disability as patient has not had scan. She had advised she would let us know when she wanted to schedule.

## 2018-07-08 NOTE — Telephone Encounter (Signed)
She wants MRI arthrogram done now. Fix note that says I recommended shoulder arthrogram MRI to determine if she needs surgery . 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  She is now scheduling MRI.

## 2018-07-08 NOTE — Telephone Encounter (Signed)
Patient requesting an continuation note for being out of work on disability.  She needs the letter to state that she will continue to be out until [place] decide that.  She was told about an MRI and it has yet to be scheduled. She has no idea what is going on with this.  This is and [place] office patient.   Please call her to advise. [phone]

## 2018-07-12 NOTE — Telephone Encounter (Signed)
Please see below. New order entered. Note faxed.

## 2018-07-12 NOTE — Telephone Encounter (Signed)
Pearson Grippe with [employer] is requesting a call to discuss delay of MRI and wants to make sure that its not the patient delaying treatment. CB # [phone]   Reference claim # [id number]

## 2018-07-12 NOTE — Telephone Encounter (Signed)
Is this suppose to be Workers Comp?

## 2018-07-14 NOTE — Telephone Encounter (Signed)
I left voicemail for Lindsey Edwards to return my call. There is not a signed authorization on file to speak with her, however, she is the contact person per the patient to authorize and make payment for MRI.  Will wait for return call.

## 2018-08-01 ENCOUNTER — Ambulatory Visit
Admission: RE | Admit: 2018-08-01 | Discharge: 2018-08-01 | Disposition: A | Discharge status: Home / Self Care | Payer: Self-pay | Source: Ambulatory Visit | Attending: Orthopaedic Surgery | Admitting: Orthopaedic Surgery

## 2018-08-01 ENCOUNTER — Ambulatory Visit
Admission: RE | Admit: 2018-08-01 | Discharge: 2018-08-01 | Disposition: A | Discharge status: Home / Self Care | Payer: Worker's Compensation | Source: Ambulatory Visit | Attending: Orthopaedic Surgery | Admitting: Orthopaedic Surgery

## 2018-08-01 DIAGNOSIS — S43432D Superior glenoid labrum lesion of left shoulder, subsequent encounter: Secondary | ICD-10-CM

## 2018-08-01 MED ORDER — IOPAMIDOL (ISOVUE-M 200) INJECTION 41%
15.0000 mL | Freq: Once | INTRAMUSCULAR | Status: AC
  Administered 2018-08-01: 15 mL via INTRA_ARTICULAR

## 2018-08-02 ENCOUNTER — Telehealth (INDEPENDENT_AMBULATORY_CARE_PROVIDER_SITE_OTHER): Payer: Self-pay | Admitting: Orthopaedic Surgery

## 2018-08-02 NOTE — Telephone Encounter (Signed)
Patient called back and needs records by tomorrow am. I told her we would have them ready and she will sign release when she comes to pick them up.

## 2018-08-02 NOTE — Telephone Encounter (Signed)
I received vm from patient stating a facility where she had requested her records to be sent had not received her records, and so she wanted to just get a copy of her records. IC her back and advised that there is no authoriztion in her chart to release her records. Told her that she will need to sign a release form to get copy of records and we be happy to process her request.

## 2018-09-15 ENCOUNTER — Telehealth: Payer: Self-pay | Admitting: Family Medicine

## 2018-09-15 NOTE — Telephone Encounter (Signed)
Call placed to patient. LMTRC.  

## 2018-09-15 NOTE — Telephone Encounter (Signed)
Patient is calling to speak with you  Please call her at [phone]

## 2018-09-16 NOTE — Telephone Encounter (Signed)
Call placed to patient. LMTRC.  

## 2018-09-19 NOTE — Telephone Encounter (Signed)
Multiple calls placed to patient with no answer and no return call.   Message to be closed.  

## 2018-09-26 ENCOUNTER — Encounter: Payer: Self-pay | Admitting: Family Medicine

## 2018-09-26 MED ORDER — RIZATRIPTAN BENZOATE 10 MG PO TBDP: 10.0000 mg | ORAL_TABLET | ORAL | 3 refills | Status: DC | PRN

## 2018-12-22 ENCOUNTER — Encounter: Payer: Self-pay | Admitting: Family Medicine

## 2018-12-22 ENCOUNTER — Ambulatory Visit (INDEPENDENT_AMBULATORY_CARE_PROVIDER_SITE_OTHER): Payer: 59 | Admitting: Family Medicine

## 2018-12-22 ENCOUNTER — Telehealth: Payer: Self-pay | Admitting: Family Medicine

## 2018-12-22 ENCOUNTER — Other Ambulatory Visit: Payer: Self-pay

## 2018-12-22 VITALS — BP 110/72 | HR 101 | Temp 98.5°F | Resp 16 | Ht 62.01 in | Wt 189.4 lb

## 2018-12-22 DIAGNOSIS — Z6834 Body mass index (BMI) 34.0-34.9, adult: Secondary | ICD-10-CM

## 2018-12-22 DIAGNOSIS — N926 Irregular menstruation, unspecified: Secondary | ICD-10-CM | POA: Diagnosis not present

## 2018-12-22 DIAGNOSIS — N889 Noninflammatory disorder of cervix uteri, unspecified: Secondary | ICD-10-CM | POA: Diagnosis not present

## 2018-12-22 DIAGNOSIS — Z0000 Encounter for general adult medical examination without abnormal findings: Secondary | ICD-10-CM

## 2018-12-22 DIAGNOSIS — R87619 Unspecified abnormal cytological findings in specimens from cervix uteri: Secondary | ICD-10-CM | POA: Diagnosis not present

## 2018-12-22 DIAGNOSIS — Z124 Encounter for screening for malignant neoplasm of cervix: Secondary | ICD-10-CM | POA: Diagnosis not present

## 2018-12-22 DIAGNOSIS — N912 Amenorrhea, unspecified: Secondary | ICD-10-CM | POA: Diagnosis not present

## 2018-12-22 DIAGNOSIS — Z789 Other specified health status: Secondary | ICD-10-CM

## 2018-12-22 DIAGNOSIS — E669 Obesity, unspecified: Secondary | ICD-10-CM

## 2018-12-22 DIAGNOSIS — R635 Abnormal weight gain: Secondary | ICD-10-CM

## 2018-12-22 DIAGNOSIS — R109 Unspecified abdominal pain: Secondary | ICD-10-CM

## 2018-12-22 LAB — URINALYSIS, ROUTINE W REFLEX MICROSCOPIC
BILIRUBIN URINE: NEGATIVE
GLUCOSE, UA: NEGATIVE
Hgb urine dipstick: NEGATIVE
Ketones, ur: NEGATIVE
Leukocytes,Ua: NEGATIVE
Nitrite: NEGATIVE
PH: 5.5 (ref 5.0–8.0)
PROTEIN: NEGATIVE
Specific Gravity, Urine: 1.031 (ref 1.001–1.03)

## 2018-12-22 LAB — WET PREP FOR TRICH, YEAST, CLUE

## 2018-12-22 LAB — PREGNANCY, URINE: Preg Test, Ur: NEGATIVE

## 2018-12-22 MED ORDER — METRONIDAZOLE 500 MG PO TABS: 500.0000 mg | ORAL_TABLET | Freq: Two times a day (BID) | ORAL | 0 refills | Status: AC

## 2018-12-22 NOTE — Telephone Encounter (Signed)
Patient called back in to say that her mother and sister were both diagnosed with Adenomyosis and this caused her sister to have a hysterectomy at age 29.

## 2018-12-22 NOTE — Progress Notes (Signed)
Patient ID: Lindsey Edwards, female    DOB: [birth year], 28 y.o.   MRN: [id number]  PCP: Salley Scarlet, MD  Chief Complaint  Patient presents with   Gynecologic Exam    Patient in today for a pap smear. Has concerns of amenorrhea. Has not had a cycle since september 2019    Subjective:    Lindsey Edwards is a 28 y.o. female, presents to clinic with CC of need for physical and PAP, but she also has several complaints and so CC were addressed and she will return next week to do well visit/CPE. She complains of amenorrhea with cramping for almost 6 months.  She also complains of almost 30 pounds weight gain although she continues to have a very healthy diet and is very active with a strenuous job where she walks long distances.  Patient states that she is due for Pap, she denies any pertinent OBGYN hx, but then much later states that she does need to establish with OB/GYN locally, she states she has not seen one here or done her Pap for 4 to 5 years, she also notes that she has a history of abnormal cervix and had a biopsy.  She additionally adds that her mother and her sister have similar abnormal Pap smears but she denies any history of cancer.  After the visit she did call us back and added further that her mother and her sister have a diagnosis of adenomyosis and have had to had hysterectomy at 28 years old. Patient states that she was on oral birth control pills until September, when she took the placebo week she had normal withdrawal bleeding but she has not had any spotting or bleeding since then.  She endorses having symptoms of a period with cramping intermittently, but she denies any spotting, denies vaginal discharge.  She does not use any birth control at all since September she is sexually active with one female partner she has been with her fianc for 1 year, she denies any history of STDs or suspicion of STDs.  She denies any history of PCOS, PID, surgeries or other procedures.  She has  taken several home pregnancy tests which have all been negative.  She denies any urinary symptoms, dyspareunia, abdominal bloating, domino masses, flank pain. For her whole life since she began having periods as a teenager she always had a period every 3-1/2 to 4 weeks with 5 days of bleeding which were very light.  She never had any irregular or heavy periods.  There is one occasion during especially full period of time for her that she skipped 1 month of menses but then she was read back on her normal cycle.  The first month that she did not have menses, last October, she assumed it was from increased stress, between September and 2023-10-09 there was a death in the family, she had a shoulder surgery, they were moving, she was changing jobs.  She was not surprised by the skip period because it had happened once before.  She is hoping to conceive.   Patient is currently working security at [employer], she works third shift, she says she sleeps really well, over the last 6 months went from 160 lbs to dearly 190 lbs, and she adamantly denies any change to her eating habits or change to her activity level.  He says she does not write down her monitor what she eats but she eats very healthy, small meals, denies any excessive eating of  sweets or fast food.  She reports that on her phone it records her activity level and at work she is walking upwards of 12 miles a day.  She does not exercise outside of work.    Of note she is prescribed multiple stimulants for ADD, she reports not taking it as frequently as before but she says this is not a huge difference she never took it daily, was more "as needed" for some days during the week.  She is not taking it very much now occasionally 1 dose per week.  She denies any hair loss, skin or hair changes, peripheral edema, mood changes, bowel changes.  She denies any abnormal hair growth patterns.  She is no longer taking most of her medications has not been taking  albuterol, cough medicines, Focalin, Ritalin, her oral birth control pill, she only continues to take Maxalt and trazodone  PHQ 9 depression screening was done today she endorses several days of feeling tired or having little energy, several days of poor appetite or overeating and more than half the days trouble falling asleep or staying asleep or sleeping too much -and she does specify that she sleeps a lot and her symptoms make problems at work or home somewhat difficult but she denies feeling down depressed or hopeless and denies loss of interest or pleasure in things.       Patient Active Problem List   Diagnosis Date Noted   PTSD (post-traumatic stress disorder) 08/23/2017   Insomnia 03/23/2014   Migraines 02/23/2014   ADHD, predominantly hyperactive-impulsive subtype 02/23/2014   Overweight 02/23/2014     Prior to Admission medications   Medication Sig Start Date End Date Taking? Authorizing Provider  Melatonin 10 MG TABS Take 1 tablet by mouth at bedtime.   Yes [provider]  meloxicam (MOBIC) 15 MG tablet Take 1 tablet (15 mg total) by mouth daily. 05/05/18  Yes Eldred Manges, MD  mupirocin ointment (BACTROBAN) 2 % Place 1 application into the nose 2 (two) times daily. Into nose and surrounding affected skin 03/18/18  Yes Danelle Berry, PA-C  Norgestimate-Ethinyl Estradiol Triphasic (TRI-SPRINTEC) 0.18/0.215/0.25 MG-35 MCG tablet Take 1 tablet daily by mouth. 08/23/17  Yes [place], Velna Hatchet, MD  rizatriptan (MAXALT-MLT) 10 MG disintegrating tablet Take 1 tablet (10 mg total) by mouth as needed for migraine. May repeat in 2 hours if needed 09/26/18  Yes [place], Velna Hatchet, MD  traZODone (DESYREL) 50 MG tablet Take 1 tablet (50 mg total) by mouth at bedtime. 01/10/18  Yes , Velna Hatchet, MD  dexmethylphenidate (FOCALIN XR) 15 MG 24 hr capsule Take 1 capsule (15 mg total) by mouth daily. Patient not taking: Reported on 12/22/2018 09/07/17   Salley Scarlet, MD    methylphenidate (RITALIN) 10 MG tablet Take 1 tablet q PM PRN when Concerta has/ is wearing off. Patient not taking: Reported on 12/22/2018 08/23/17   Salley Scarlet, MD     Allergies  Allergen Reactions   Keflex [Cephalexin] Anaphylaxis   Penicillins Anaphylaxis   Septra [Sulfamethoxazole-Trimethoprim] Anaphylaxis   Sulfa Antibiotics Anaphylaxis   Pecan Pollen Hives     Family History  Problem Relation Age of Onset   Depression Mother    Heart disease Mother    Arthritis Maternal Grandmother    Hypothyroidism Maternal Grandmother    Mental illness Maternal Grandmother        OCD   Hyperlipidemia Maternal Grandmother    Heart disease Maternal Grandmother    Cancer Maternal Grandfather  Hearing loss Maternal Grandfather    Hypertension Maternal Grandfather    Asthma Paternal Grandfather      Social History   Socioeconomic History   Marital status: Significant Other    Spouse name: fiance   Number of children: Not on file   Years of education: Not on file   Highest education level: Not on file  Occupational History   Not on file  Social Needs   Financial resource strain: Not on file   Food insecurity:    Worry: Not on file    Inability: Not on file   Transportation needs:    Medical: Not on file    Non-medical: Not on file  Tobacco Use   Smoking status: Former Smoker    Last attempt to quit: 11/28/2013    Years since quitting: 5.0   Smokeless tobacco: Former Neurosurgeon    Types: Snuff    Quit date: 02/21/2014  Substance and Sexual Activity   Alcohol use: Yes    Alcohol/week: 2.0 standard drinks    Types: 2 Standard drinks or equivalent per week   Drug use: No   Sexual activity: Yes  Lifestyle   Physical activity:    Days per week: Not on file    Minutes per session: Not on file   Stress: Not on file  Relationships   Social connections:    Talks on phone: Not on file    Gets together: Not on file    Attends religious  service: Not on file    Active member of club or organization: Not on file    Attends meetings of clubs or organizations: Not on file    Relationship status: Not on file   Intimate partner violence:    Fear of current or ex partner: Not on file    Emotionally abused: Not on file    Physically abused: Not on file    Forced sexual activity: Not on file  Other Topics Concern   Not on file  Social History Narrative   Not on file     Review of Systems     Objective:    Vitals:   12/22/18 1118  BP: 110/72  Pulse: (!) 101  Resp: 16  Temp: 98.5 F (36.9 C)  TempSrc: Oral  SpO2: 96%  Weight: 189 lb 6.4 oz (85.9 kg)  Height: 5' 2.01" (1.575 m)      Physical Exam Vitals signs and nursing note reviewed. Exam conducted with a chaperone present.  Constitutional:      General: She is not in acute distress.    Appearance: Normal appearance. She is well-developed. She is obese. She is not toxic-appearing or diaphoretic.  HENT:     Head: Normocephalic and atraumatic.     Right Ear: External ear normal.     Left Ear: External ear normal.     Nose: Nose normal.     Mouth/Throat:     Pharynx: Uvula midline.  Eyes:     General: Lids are normal.     Conjunctiva/sclera: Conjunctivae normal.     Pupils: Pupils are equal, round, and reactive to light.  Neck:     Musculoskeletal: Normal range of motion and neck supple.     Trachea: Phonation normal. No tracheal deviation.  Cardiovascular:     Rate and Rhythm: Normal rate and regular rhythm.     Pulses: Normal pulses.          Radial pulses are 2+ on the right side and 2+  on the left side.       Posterior tibial pulses are 2+ on the right side and 2+ on the left side.     Heart sounds: Normal heart sounds. No murmur. No friction rub. No gallop.   Pulmonary:     Effort: Pulmonary effort is normal. No respiratory distress.     Breath sounds: Normal breath sounds. No stridor. No wheezing, rhonchi or rales.  Chest:     Chest wall:  No tenderness.  Abdominal:     General: Bowel sounds are normal. There is no distension.     Palpations: Abdomen is soft. There is no mass.     Tenderness: There is abdominal tenderness (mild suprapubic tenderness). There is no guarding or rebound.  Genitourinary:    Labia:        Right: Lesion present. No rash.        Left: Lesion present. No rash.      Vagina: Vaginal discharge present. No erythema, bleeding or lesions.     Cervix: Discharge (scant purulent tan), friability, erythema and eversion present. No cervical motion tenderness.     Uterus: Normal. Not enlarged.      Adnexa: Right adnexa normal and left adnexa normal.       Right: No mass, tenderness or fullness.         Left: No mass, tenderness or fullness.       Comments: PAP, wet prep and STD testing swabs obtained from cervix and endocervical canal Musculoskeletal: Normal range of motion.        General: No deformity.  Lymphadenopathy:     Cervical: No cervical adenopathy.  Skin:    General: Skin is warm and dry.     Capillary Refill: Capillary refill takes less than 2 seconds.     Coloration: Skin is not pale.     Findings: No rash.  Neurological:     Mental Status: She is alert and oriented to person, place, and time.     Motor: No abnormal muscle tone.     Gait: Gait normal.  Psychiatric:        Speech: Speech normal.        Behavior: Behavior normal.           Assessment & Plan:    Patient is a 28 year old female presents with multiple complaints and is due for Pap she reports history of abnormal Paps with biopsy in the past.  Per chart review I can see the most recent one is in 2016 was normal.   She has messaged through my chart multiple times after leaving clinic today she was able to find out that her sister and mother both have adenomyosis and they both had hysterectomies in their early 30s.  The patient sounded very upset because she would like to conceive.    Plan is to obtain Pap STD screening,  wet prep urine and urine pregnancy test, obtain pelvic complete and transvaginal ultrasound to further assess amenorrhea.  She has been on oral birth control pills for about 14 years and since discontinuing 6 months ago she only had 1 withdrawal bleed and no return to normal menstrual cycles.  Also has weight gain, fatigue sleeping excessively.  Having some cramps which she describes as similar to symptoms of her menses, but she denies any vaginal spotting or any vaginal discharge.  She also endorses being with the same one female sexual partner for the past years with this technically makes her low risk.  The appearance of the cervix was very abnormal appeared hypertrophic, erythematous and very friable when obtaining Pap and other swabs.  She had no CMT or adnexal tenderness so I doubt PID.    I do think that is likely contributing factors are discontinuation of oral birth control pills and I would like to rule out PCOS.  I have explained the work-up and plan to the patient thoroughly at the time that she was in office and afterwards through multiple MyChart messages -explained that the ultrasound should give [place] a better idea about her uterine size, endometrial thickness and size of ovaries.  We would be able to possibly rule and rule out some abnormalities and would definitely know if there is findings concerning for adenomyosis, polyps or PCOS etc. but we will address that when we get the results.   For her weight gain energy will obtain labs as listed below would like to rule out hypothyroid, will screen for metabolic syndromes, hyperlipidemia, diabetes.  Other routine labs were obtained at the same time so she can return in the remainder of her well visit.  Computer system and electronic medical record was on downtime and was unacceptable at the time of her visit, lab results, lab orders were all put in later in the day and patient was contacted and notified that her wet prep was pertinent for clue cells  which is BV and this was treated with Flagyl the medicine was prescribed and administration and avoidance of EtOH was reviewed.   Recent Results (from the past 2160 hour(s))  WET PREP FOR TRICH, YEAST, CLUE     Status: None   Collection Time: 12/22/18  9:30 AM  Result Value Ref Range   Source: VAGINA    RESULT      Comment: TRICHOMONAS-NONE SEEN YEAST-NONE SEEN CLUE CELLS-PRESENT EPITHELIAL CELLS-PRESENT WBC-FEW BACTERIA-MODERATE   Urinalysis, Routine w reflex microscopic     Status: None   Collection Time: 12/22/18  9:50 AM  Result Value Ref Range   Color, Urine YELLOW YELLOW   APPearance CLEAR CLEAR   Specific Gravity, Urine 1.031 1.001 - 1.03    Comment: Verified by repeat analysis. .    pH 5.5 5.0 - 8.0   Glucose, UA NEGATIVE NEGATIVE   Bilirubin Urine NEGATIVE NEGATIVE   Ketones, ur NEGATIVE NEGATIVE   Hgb urine dipstick NEGATIVE NEGATIVE   Protein, ur NEGATIVE NEGATIVE   Nitrite NEGATIVE NEGATIVE   Leukocytes,Ua NEGATIVE NEGATIVE  Pregnancy, urine     Status: None   Collection Time: 12/22/18  9:50 AM  Result Value Ref Range   Preg Test, Ur NEGATIVE NEGATIVE  Ua and POC urine preg negative.     ICD-10-CM   1. Irregular periods N92.6 Urinalysis, Routine w reflex microscopic    Pregnancy, urine    US PELVIC COMPLETE WITH TRANSVAGINAL    Ambulatory referral to Obstetrics / Gynecology  2. Abnormal cervical Papanicolaou smear, unspecified abnormal pap finding R87.619 WET PREP FOR TRICH, YEAST, CLUE    PAP,TP IMGw/HPV RNA,rflx HPVTYPE16,18/45    US PELVIC COMPLETE WITH TRANSVAGINAL    Ambulatory referral to Obstetrics / Gynecology  3. Weight gain R63.5 TSH    T4, free  4. Class 1 obesity with body mass index (BMI) of 34.0 to 34.9 in adult, unspecified obesity type, unspecified whether serious comorbidity present E66.9 COMPLETE METABOLIC PANEL WITH GFR   Z68.34 Hemoglobin A1c    Lipid panel  5. Encounter for Papanicolaou smear for cervical cancer screening Z12.4  PAP,TP IMGw/HPV  RNA,rflx [id number],87/68    Ambulatory referral to Obstetrics / Gynecology  6. Abnormal appearance of cervix N88.9 WET PREP FOR TRICH, YEAST, CLUE    PAP,TP IMGw/HPV RNA,rflx HPVTYPE16,18/45    Trichomonas vaginalis, RNA    C. trachomatis/N. gonorrhoeae RNA    US PELVIC COMPLETE WITH TRANSVAGINAL    Ambulatory referral to Obstetrics / Gynecology  7. Postpill amenorrhea N91.2 Urinalysis, Routine w reflex microscopic    Pregnancy, urine    US PELVIC COMPLETE WITH TRANSVAGINAL    Ambulatory referral to Obstetrics / Gynecology  8. Abdominal cramps R10.9 CBC with Differential/Platelet    COMPLETE METABOLIC PANEL WITH GFR    Urinalysis, Routine w reflex microscopic    Pregnancy, urine    US PELVIC COMPLETE WITH TRANSVAGINAL    Ambulatory referral to Obstetrics / Gynecology  9. General medical exam Z00.00 CBC with Differential/Platelet    COMPLETE METABOLIC PANEL WITH GFR    Hemoglobin A1c    Lipid panel    TSH   basic screeniing labs done, pt with multiple CC, will return to complete CPE  10. Attempting to conceive Z78.9 Ambulatory referral to Obstetrics / Gynecology    Greater than 50% of this visit was spent in direct face-to-face counseling, obtaining history and physical, discussing and educating pt on treatment plan.  Total time of this visit was 45 min.  Remainder of time involved but was not limited to reviewing chart (recent and pertinent OV notes and labs), documentation in EMR, and coordinating care and treatment plan.    Danelle Berry, PA-C 12/22/18 8:22 PM

## 2018-12-23 ENCOUNTER — Encounter: Payer: Self-pay | Admitting: Family Medicine

## 2018-12-23 LAB — PAP, TP IMAGING W/ HPV RNA, RFLX HPV TYPE 16,18/45: HPV DNA High Risk: NOT DETECTED

## 2018-12-23 LAB — COMPLETE METABOLIC PANEL WITH GFR
AG RATIO: 1.5 (calc) (ref 1.0–2.5)
ALT: 13 U/L (ref 6–29)
AST: 18 U/L (ref 10–30)
Albumin: 4 g/dL (ref 3.6–5.1)
Alkaline phosphatase (APISO): 85 U/L (ref 31–125)
BUN: 12 mg/dL (ref 7–25)
CO2: 27 mmol/L (ref 20–32)
Calcium: 9.3 mg/dL (ref 8.6–10.2)
Chloride: 107 mmol/L (ref 98–110)
Creat: 0.82 mg/dL (ref 0.50–1.10)
GFR, EST AFRICAN AMERICAN: 113 mL/min/{1.73_m2} (ref >=60)
GFR, Est Non African American: 97 mL/min/{1.73_m2} (ref >=60)
GLUCOSE: 98 mg/dL (ref 65–99)
Globulin: 2.7 g/dL (calc) (ref 1.9–3.7)
Potassium: 4.8 mmol/L (ref 3.5–5.3)
Sodium: 142 mmol/L (ref 135–146)
TOTAL PROTEIN: 6.7 g/dL (ref 6.1–8.1)
Total Bilirubin: 0.3 mg/dL (ref 0.2–1.2)

## 2018-12-23 LAB — CBC WITH DIFFERENTIAL/PLATELET
Absolute Monocytes: 857 cells/uL (ref 200–950)
BASOS PCT: 0.5 %
Basophils Absolute: 42 cells/uL (ref 0–200)
Eosinophils Absolute: 160 cells/uL (ref 15–500)
Eosinophils Relative: 1.9 %
HCT: 38.4 % (ref 35.0–45.0)
Hemoglobin: 12.3 g/dL (ref 11.7–15.5)
Lymphs Abs: 2713 cells/uL (ref 850–3900)
MCH: 29.1 pg (ref 27.0–33.0)
MCHC: 32 g/dL (ref 32.0–36.0)
MCV: 91 fL (ref 80.0–100.0)
MONOS PCT: 10.2 %
MPV: 10.5 fL (ref 7.5–12.5)
NEUTROS PCT: 55.1 %
Neutro Abs: 4628 cells/uL (ref 1500–7800)
PLATELETS: 294 10*3/uL (ref 140–400)
RBC: 4.22 10*6/uL (ref 3.80–5.10)
RDW: 12.5 % (ref 11.0–15.0)
TOTAL LYMPHOCYTE: 32.3 %
WBC: 8.4 10*3/uL (ref 3.8–10.8)

## 2018-12-23 LAB — T4, FREE: Free T4: 1 ng/dL (ref 0.8–1.8)

## 2018-12-23 LAB — LIPID PANEL
CHOLESTEROL: 143 mg/dL (ref <200)
HDL: 42 mg/dL — ABNORMAL LOW (ref >=50)
LDL CHOLESTEROL (CALC): 84 mg/dL
Non-HDL Cholesterol (Calc): 101 mg/dL (calc) (ref <130)
TRIGLYCERIDES: 78 mg/dL (ref <150)
Total CHOL/HDL Ratio: 3.4 (calc) (ref <5.0)

## 2018-12-23 LAB — HEMOGLOBIN A1C
EAG (MMOL/L): 6.3 (calc)
Hgb A1c MFr Bld: 5.6 % of total Hgb (ref <5.7)
Mean Plasma Glucose: 114 (calc)

## 2018-12-23 LAB — TRICHOMONAS VAGINALIS, PROBE AMP: Trichomonas vaginalis RNA: NOT DETECTED

## 2018-12-23 LAB — C. TRACHOMATIS/N. GONORRHOEAE RNA
C. trachomatis RNA, TMA: NOT DETECTED
N. gonorrhoeae RNA, TMA: NOT DETECTED

## 2018-12-23 LAB — TSH: TSH: 1.76 m[IU]/L

## 2018-12-23 NOTE — Addendum Note (Signed)
Addended by: Danelle Berry on: 12/23/2018 08:19 AM   Modules accepted: Orders

## 2019-01-07 ENCOUNTER — Telehealth: Payer: Self-pay | Admitting: Family

## 2019-01-07 DIAGNOSIS — R6889 Other general symptoms and signs: Secondary | ICD-10-CM

## 2019-01-07 DIAGNOSIS — Z7189 Other specified counseling: Secondary | ICD-10-CM

## 2019-01-07 MED ORDER — BENZONATATE 100 MG PO CAPS: 100.0000 mg | ORAL_CAPSULE | Freq: Three times a day (TID) | ORAL | 0 refills | Status: DC | PRN

## 2019-01-07 NOTE — Progress Notes (Signed)
E-Visit for Corona Virus Screening  Based on your current symptoms, you may very well have the virus, however your symptoms are mild. Currently, not all patients are being tested. If the symptoms are mild and there is not a known exposure, performing the test is not indicated.   Approximately 5 minutes was spent documenting and reviewing patient's chart.    Coronavirus disease 2019 (COVID-19) is a respiratory illness that can spread from person to person. The virus that causes COVID-19 is a new virus that was first identified in the country of [place] but is now found in multiple other countries and has spread to the [place].  Symptoms associated with the virus are mild to severe fever, cough, and shortness of breath. There is currently no vaccine to protect against COVID-19, and there is no specific antiviral treatment for the virus.   To be considered HIGH RISK for Coronavirus (COVID-19), you have to meet the following criteria:  . Traveled to [place], [place], [place], [place] or [place]; or in the [place] to [place], [place], [place], or [place]; and have fever, cough, and shortness of breath within the last 2 weeks of travel OR  . Been in close contact with a person diagnosed with COVID-19 within the last 2 weeks and have fever, cough, and shortness of breath  . IF YOU DO NOT MEET THESE CRITERIA, YOU ARE CONSIDERED LOW RISK FOR COVID-19.   It is vitally important that if you feel that you have an infection such as this virus or any other virus that you stay home and away from places where you may spread it to others.  You should self-quarantine for 14 days if you have symptoms that could potentially be coronavirus and avoid contact with people age 65 and older.   You can use medication such as A prescription cough medication called Tessalon Perles 100 mg. You may take 1-2 capsules every 8 hours as needed for cough  You may also take acetaminophen (Tylenol) as needed for  fever.   Reduce your risk of any infection by using the same precautions used for avoiding the common cold or flu:  . Wash your hands often with soap and warm water for at least 20 seconds.  If soap and water are not readily available, use an alcohol-based hand sanitizer with at least 60% alcohol.  . If coughing or sneezing, cover your mouth and nose by coughing or sneezing into the elbow areas of your shirt or coat, into a tissue or into your sleeve (not your hands). . Avoid shaking hands with others and consider head nods or verbal greetings only. . Avoid touching your eyes, nose, or mouth with unwashed hands.  . Avoid close contact with people who are sick. . Avoid places or events with large numbers of people in one location, like concerts or sporting events. . Carefully consider travel plans you have or are making. . If you are planning any travel outside or inside the [place], visit the CDC's Travelers' Health webpage for the latest health notices. . If you have some symptoms but not all symptoms, continue to monitor at home and seek medical attention if your symptoms worsen. . If you are having a medical emergency, call 911.  HOME CARE . Only take medications as instructed by your medical team. . Drink plenty of fluids and get plenty of rest. . A steam or ultrasonic humidifier can help if you have congestion.   GET HELP RIGHT AWAY IF: .   You develop worsening fever. . You become short of breath . You cough up blood. . Your symptoms become more severe MAKE SURE YOU   Understand these instructions.  Will watch your condition.  Will get help right away if you are not doing well or get worse.  Your e-visit answers were reviewed by a board certified advanced clinical practitioner to complete your personal care plan.  Depending on the condition, your plan could have included both over the counter or prescription medications.  If there is a problem please reply once you have received a  response from your provider. Your safety is important to us.  If you have drug allergies check your prescription carefully.    You can use MyChart to ask questions about today's visit, request a non-urgent call back, or ask for a work or school excuse for 24 hours related to this e-Visit. If it has been greater than 24 hours you will need to follow up with your provider, or enter a new e-Visit to address those concerns. You will get an e-mail in the next two days asking about your experience.  I hope that your e-visit has been valuable and will speed your recovery. Thank you for using e-visits.    

## 2019-01-07 NOTE — Progress Notes (Signed)
Symptomatic control of symptoms with OTC meds. As well as quarantine you and everyone in your house until no has any symptoms.   Please continue good preventive care measures, including: frequent hand-washing, avoid touching your face, cover coughs/sneezes, stay out of crowds and keep a 6 foot distance from others. If you develop fever/cough/breathlessness, please stay home for 7 days and until you have had 3 consecutive days with cough/breathlessness improving and without fever (without taking a fever reducer). Go to the nearest hospital ED tent for assessment if fever/cough/breathlessness are severe or illness seems like a threat to life.

## 2019-01-30 ENCOUNTER — Ambulatory Visit (HOSPITAL_COMMUNITY): Payer: 59

## 2019-02-21 ENCOUNTER — Telehealth: Payer: 59 | Admitting: Nurse Practitioner

## 2019-02-21 DIAGNOSIS — J029 Acute pharyngitis, unspecified: Secondary | ICD-10-CM

## 2019-02-21 DIAGNOSIS — R05 Cough: Secondary | ICD-10-CM | POA: Diagnosis not present

## 2019-02-21 DIAGNOSIS — Z7189 Other specified counseling: Secondary | ICD-10-CM

## 2019-02-21 DIAGNOSIS — R059 Cough, unspecified: Secondary | ICD-10-CM

## 2019-02-21 MED ORDER — BENZONATATE 100 MG PO CAPS: 100.0000 mg | ORAL_CAPSULE | Freq: Three times a day (TID) | ORAL | 0 refills | Status: DC | PRN

## 2019-02-21 NOTE — Progress Notes (Signed)
Based on your questionaire answers I believe that is the correct diagnosis. I will be glad to call you in some tessalon perles for your cough. I do not feel that an antibiotic is warranted at this time. Orders Placed This Encounter     benzonatate (TESSALON PERLES) 100 MG capsule         Sig: Take 1 capsule (100 mg total) by mouth 3 (three) times daily as needed.         Dispense:  20 capsule         Refill:  0         Order Specific Question: Supervising Provider         Answer: Eber Hong [[id number]]  Providers prescribe antibiotics to treat infections caused by bacteria. Antibiotics are very powerful in treating bacterial infections when they are used properly. To maintain their effectiveness, they should be used only when necessary. Overuse of antibiotics has resulted in the development of superbugs that are resistant to treatment!   After careful review of your answers, I would not recommend an antibiotic for your condition. Antibiotics are not effective against viruses and therefore should not be used to treat them. Common examples of infections caused by viruses include colds and flu

## 2019-02-21 NOTE — Progress Notes (Signed)

## 2019-02-21 NOTE — Progress Notes (Signed)
E-Visit for Corona Virus Screening  Based on your current symptoms, it seems unlikely that your symptoms are related to the Coronavirus.  Since you tested negative yesterday. That doe snot 100% mean that you do not have covid 19. You could very well test positive in th enext few days.  You have been enrolled in MyChart Home Monitoring for COVID-19. Daily you will receive a questionnaire within the MyChart website. Our COVID-19 response team will be monitoring your responses daily.   Coronavirus disease 2019 (COVID-19) is a respiratory illness that can spread from person to person. The virus that causes COVID-19 is a new virus that was first identified in the country of [place] but is now found in multiple other countries and has spread to the [place].  Symptoms associated with the virus are mild to severe fever, cough, and shortness of breath. There is currently no vaccine to protect against COVID-19, and there is no specific antiviral treatment for the virus.   To be considered HIGH RISK for Coronavirus (COVID-19), you have to meet the following criteria:  . Traveled to [place], [place], [place], [place] or [place]; or in the [place] to [place], [place], [place], or [place]; and have fever, cough, and shortness of breath within the last 2 weeks of travel OR  . Been in close contact with a person diagnosed with COVID-19 within the last 2 weeks and have fever, cough, and shortness of breath  . IF YOU DO NOT MEET THESE CRITERIA, YOU ARE CONSIDERED LOW RISK FOR COVID-19.   It is vitally important that if you feel that you have an infection such as this virus or any other virus that you stay home and away from places where you may spread it to others.  You should self-quarantine for 14 days if you have symptoms that could potentially be coronavirus and avoid contact with people age 51 and older.   You can use medication such as the tessalon perles that were prescribed in your last  evisit  You may also take acetaminophen (Tylenol) as needed for fever.   Reduce your risk of any infection by using the same precautions used for avoiding the common cold or flu:  Marland Kitchen Wash your hands often with soap and warm water for at least 20 seconds.  If soap and water are not readily available, use an alcohol-based hand sanitizer with at least 60% alcohol.  . If coughing or sneezing, cover your mouth and nose by coughing or sneezing into the elbow areas of your shirt or coat, into a tissue or into your sleeve (not your hands). . Avoid shaking hands with others and consider head nods or verbal greetings only. . Avoid touching your eyes, nose, or mouth with unwashed hands.  . Avoid close contact with people who are sick. . Avoid places or events with large numbers of people in one location, like concerts or sporting events. . Carefully consider travel plans you have or are making. . If you are planning any travel outside or inside the [place], visit the CDC's Travelers' Health webpage for the latest health notices. . If you have some symptoms but not all symptoms, continue to monitor at home and seek medical attention if your symptoms worsen. . If you are having a medical emergency, call 911.  HOME CARE . Only take medications as instructed by your medical team. . Drink plenty of fluids and get plenty of rest. . A steam or ultrasonic humidifier can help if  you have congestion.   GET HELP RIGHT AWAY IF: . You develop worsening fever. . You become short of breath . You cough up blood. . Your symptoms become more severe MAKE SURE YOU   Understand these instructions.  Will watch your condition.  Will get help right away if you are not doing well or get worse.  Your e-visit answers were reviewed by a board certified advanced clinical practitioner to complete your personal care plan.  Depending on the condition, your plan could have included both over the counter or prescription  medications.  If there is a problem please reply once you have received a response from your provider. Your safety is important to us.  If you have drug allergies check your prescription carefully.    You can use MyChart to ask questions about today's visit, request a non-urgent call back, or ask for a work or school excuse for 24 hours related to this e-Visit. If it has been greater than 24 hours you will need to follow up with your provider, or enter a new e-Visit to address those concerns. You will get an e-mail in the next two days asking about your experience.  I hope that your e-visit has been valuable and will speed your recovery. Thank you for using e-visits.   5-10 minutes spent reviewing and documenting in chart.

## 2019-02-21 NOTE — Addendum Note (Signed)
Addended by: Bennie Pierini on: 02/21/2019 05:49 PM   Modules accepted: Orders

## 2019-02-24 ENCOUNTER — Ambulatory Visit (INDEPENDENT_AMBULATORY_CARE_PROVIDER_SITE_OTHER): Payer: 59 | Admitting: Family Medicine

## 2019-02-24 ENCOUNTER — Encounter: Payer: Self-pay | Admitting: Family Medicine

## 2019-02-24 ENCOUNTER — Other Ambulatory Visit: Payer: Self-pay

## 2019-02-24 DIAGNOSIS — Z20828 Contact with and (suspected) exposure to other viral communicable diseases: Secondary | ICD-10-CM | POA: Diagnosis not present

## 2019-02-24 DIAGNOSIS — J209 Acute bronchitis, unspecified: Secondary | ICD-10-CM | POA: Diagnosis not present

## 2019-02-24 DIAGNOSIS — Z20822 Contact with and (suspected) exposure to covid-19: Secondary | ICD-10-CM

## 2019-02-24 MED ORDER — AZITHROMYCIN 250 MG PO TABS
ORAL_TABLET | ORAL | 0 refills | Status: DC
Start: 2019-02-24 — End: 2019-03-09

## 2019-02-24 NOTE — Progress Notes (Signed)
Subjective:    Patient ID: Lindsey Edwards, female    DOB: 03-30-1991, 28 y.o.   MRN: [id number]  HPI  Patient is being seen today as a telephone visit.  Phone call began at 1157.  Phone call ended at 1218.  Patient consents to be seen by telephone.  She is currently at home on quarantine under the instruction of health at work.  I am currently in my office. 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  Therefore she has been exposed to COVID-19.  Beginning Sunday, she states she felt like she was hit by a truck.  She developed headaches, diffuse myalgias, fever from 99.5-100.4.  She reports cough productive of yellow and green sputum.  She states that she is having coughing fits where she will cough 30 or 40 times and even experience posttussive emesis.  She was checked by health at work on Monday and tested negative for COVID-19.  She denies any pleurisy.  She denies any severe shortness of breath although she is winded walking up stairs from the emergency room to the parking lot.  She also gets winded after coughing fits.  Albuterol helps with the shortness of breath after coughing fits suggesting an underlying bronchospasm from bronchitis.  Family member listen to her lungs and stated that her right upper lobe sounded wet and congested with wheezing and possible rhonchorous breath sounds.  She denies any hemoptysis.  She denies any risk factors for DVT or pulmonary embolism.  She is not on birth control.  She has no recent travel or surgery. Past Medical History:  Diagnosis Date  . ADHD (attention deficit hyperactivity disorder) 03/2004  . Migraine   . PTSD (post-traumatic stress disorder) 01/2010   dx from [facility]   Past Surgical History:  Procedure Laterality Date  . ADENOIDECTOMY Bilateral 09/1994  . COLPOSCOPY  2016   per pt  . KNEE ARTHROSCOPY Right 05/2010   debridement   . TONSILLECTOMY Bilateral 04/1995   Current Outpatient Medications on File Prior to Visit  Medication Sig Dispense Refill  . benzonatate (TESSALON PERLES) 100 MG capsule Take 1 capsule (100 mg total) by mouth 3 (three) times daily as needed. 20 capsule 0  . Melatonin 10 MG TABS Take 1 tablet by mouth at bedtime.    . meloxicam (MOBIC) 15 MG tablet Take 1 tablet (15 mg total) by mouth daily. 30 tablet 2  . mupirocin ointment (BACTROBAN) 2 % Place 1 application into the nose 2 (two) times daily. Into nose and surrounding affected skin 22 g 0  . rizatriptan (MAXALT-MLT) 10 MG disintegrating tablet Take 1 tablet (10 mg total) by mouth as needed for migraine. May repeat in 2 hours if needed 6 tablet 3  . traZODone (DESYREL) 50 MG tablet Take 1 tablet (50 mg total) by mouth at bedtime. 30 tablet 3   No current facility-administered medications on file prior to visit.    Allergies  Allergen Reactions  . Keflex [Cephalexin] Anaphylaxis  . Penicillins Anaphylaxis  . Septra [Sulfamethoxazole-Trimethoprim] Anaphylaxis  . Sulfa Antibiotics Anaphylaxis  . Pecan Pollen Hives   Social History   Socioeconomic History  . Marital status: Significant Other    Spouse name: fiance  . Number of children: Not on file  . Years of education: Not on file  . Highest education level: Not on file  Occupational History  .  Not on file  Social Needs  . Financial resource strain: Not on file  . Food insecurity:    Worry: Not on file    Inability: Not on file  . Transportation needs:    Medical: Not on file    Non-medical: Not on file  Tobacco Use  . Smoking status: Former Smoker    Last attempt to quit: 11/28/2013    Years since quitting: 5.2  . Smokeless tobacco: Former Neurosurgeon    Types: Snuff    Quit date: 02/21/2014  Substance and Sexual Activity  . Alcohol use: Yes    Alcohol/week: 2.0 standard drinks    Types: 2 Standard drinks or equivalent per week  . Drug use: No  . Sexual activity: Yes   Lifestyle  . Physical activity:    Days per week: Not on file    Minutes per session: Not on file  . Stress: Not on file  Relationships  . Social connections:    Talks on phone: Not on file    Gets together: Not on file    Attends religious service: Not on file    Active member of club or organization: Not on file    Attends meetings of clubs or organizations: Not on file    Relationship status: Not on file  . Intimate partner violence:    Fear of current or ex partner: Not on file    Emotionally abused: Not on file    Physically abused: Not on file    Forced sexual activity: Not on file  Other Topics Concern  . Not on file  Social History Narrative  . Not on file      Review of Systems  All other systems reviewed and are negative.      Objective:   Physical Exam Physical exam cannot be performed today because the patient was seen as a telephone visit       Assessment & Plan:  Bronchitis with bronchospasm - Plan: azithromycin (ZITHROMAX) 250 MG tablet  Close Exposure to Covid-19 Virus - Plan: azithromycin (ZITHROMAX) 250 MG tablet  Patient symptoms seem to be getting worse over the week.  Differential diagnosis includes COVID-19 infection, viral bronchitis, or community-acquired pneumonia.  Given the worsening fever, the worsening cough, the purulent sputum, I do not feel that the patient has a simple upper respiratory infection.  Chest x-ray would be helpful in determining between the 2.  Therefore I gave the patient the option of going to the emergency room for a chest x-ray and repeat COVID-19 testing as she may have had a false negative result or empiric treatment with a Z-Pak for possible community-acquired pneumonia.  Patient does not feel that her shortness of breath warrants going to the emergency room.  Therefore she would like to try treatment for bacterial pneumonia with a Z-Pak and continue self quarantine for an additional 7 days.  If she develops worsening  shortness of breath, worsening cough, I would become extremely concerned that she has COVID-19 recommend that she go to the emergency room immediately for repeat testing and chest imaging.  Patient agrees to the stipulations.  I explained to the patient this is not ideal and I would rather get imaging however we cannot do walking chest x-rays due to her symptoms and I do not feel that her symptoms rise to the level of going to the emergency room yet therefore I will try empiric therapy with an antibiotic first out of an abundance of caution.  Patient  understands and is willing to attempt this

## 2019-02-27 ENCOUNTER — Ambulatory Visit (INDEPENDENT_AMBULATORY_CARE_PROVIDER_SITE_OTHER): Payer: 59 | Admitting: Family Medicine

## 2019-02-27 ENCOUNTER — Other Ambulatory Visit: Payer: Self-pay

## 2019-02-27 DIAGNOSIS — J209 Acute bronchitis, unspecified: Secondary | ICD-10-CM | POA: Diagnosis not present

## 2019-02-27 MED ORDER — HYDROCODONE-HOMATROPINE 5-1.5 MG/5ML PO SYRP: 5.0000 mL | ORAL_SOLUTION | Freq: Three times a day (TID) | ORAL | 0 refills | Status: DC | PRN

## 2019-02-27 MED ORDER — LEVOCETIRIZINE DIHYDROCHLORIDE 5 MG PO TABS
5.0000 mg | ORAL_TABLET | Freq: Every evening | ORAL | 0 refills | Status: DC
Start: 2019-02-27 — End: 2019-03-09

## 2019-02-27 NOTE — Progress Notes (Signed)
Subjective:    Patient ID: Lindsey Edwards, female    DOB: 12/26/1990, 28 y.o.   MRN: [id number]  HPI  02/24/19 Patient is being seen today as a telephone visit.  Phone call began at 1157.  Phone call ended at 1218.  Patient consents to be seen by telephone.  She is currently at home on quarantine under the instruction of health at work.  I am currently in my office. 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  Therefore she has been exposed to COVID-19.  Beginning Sunday, she states she felt like she was hit by a truck.  She developed headaches, diffuse myalgias, fever from 99.5-100.4.  She reports cough productive of yellow and green sputum.  She states that she is having coughing fits where she will cough 30 or 40 times and even experience posttussive emesis.  She was checked by health at work on Monday and tested negative for COVID-19.  She denies any pleurisy.  She denies any severe shortness of breath although she is winded walking up stairs from the emergency room to the parking lot.  She also gets winded after coughing fits.  Albuterol helps with the shortness of breath after coughing fits suggesting an underlying bronchospasm from bronchitis.  Family member listen to her lungs and stated that her right upper lobe sounded wet and congested with wheezing and possible rhonchorous breath sounds.  She denies any hemoptysis.  She denies any risk factors for DVT or pulmonary embolism.  She is not on birth control.  She has no recent travel or surgery.  At that time, my plan was: Patient symptoms seem to be getting worse over the week.  Differential diagnosis includes COVID-19 infection, viral bronchitis, or community-acquired pneumonia.  Given the worsening fever, the worsening cough, the purulent sputum, I do not feel that the patient has a simple upper respiratory infection.  Chest x-ray would  be helpful in determining between the 2.  Therefore I gave the patient the option of going to the emergency room for a chest x-ray and repeat COVID-19 testing as she may have had a false negative result or empiric treatment with a Z-Pak for possible community-acquired pneumonia.  Patient does not feel that her shortness of breath warrants going to the emergency room.  Therefore she would like to try treatment for bacterial pneumonia with a Z-Pak and continue self quarantine for an additional 7 days.  If she develops worsening shortness of breath, worsening cough, I would become extremely concerned that she has COVID-19 recommend that she go to the emergency room immediately for repeat testing and chest imaging.  Patient agrees to the stipulations.  I explained to the patient this is not ideal and I would rather get imaging however we cannot do walking chest x-rays due to her symptoms and I do not feel that her symptoms rise to the level of going to the emergency room yet therefore I will try empiric therapy with an antibiotic first out of an abundance of caution.  Patient understands and is willing to attempt this  02/27/19 Patient is being seen today as a telephone visit for follow-up from her visit last Friday.  Patient consents to be seen over the telephone.  Phone call began at 232.  Phone call ended to 247.  Overall patient is doing much better.  Her fever curve is trending down.  However she did have a fever this morning to 100.6.  However Tylenol makes the fever go away quickly and she does not feel as weak and is tired as last week.  Her shortness of breath is also improving.  Her sinus congestion is improving.  She was experiencing eustachian tube dysfunction last week and that is improved dramatically.  Also the objective lymphadenopathy in her neck has improved as well as her sore throat.  She continues to have rhinorrhea and head congestion and postnasal drip and chest congestion however this is  improved dramatically.  She is also sleeping better.  Previously she was only sleeping about 30 minutes before coughing fits would awaken her.  She was also short of breath.  Now she is sleeping 4-1/2 to 5 hours and she has no shortness of breath.  Patient definitely states that she is feeling better.  She has an appointment tomorrow with health and work to have a repeat COVID-19 test which I certainly agree with is my leading suspicion is that the patient has COVID-19 but is gradually recovering Past Medical History:  Diagnosis Date   ADHD (attention deficit hyperactivity disorder) 03/2004   Migraine    PTSD (post-traumatic stress disorder) 01/2010   dx from [facility]   Past Surgical History:  Procedure Laterality Date   ADENOIDECTOMY Bilateral 09/1994   COLPOSCOPY  2016   per pt   KNEE ARTHROSCOPY Right 05/2010   debridement   TONSILLECTOMY Bilateral 04/1995   Current Outpatient Medications on File Prior to Visit  Medication Sig Dispense Refill   azithromycin (ZITHROMAX) 250 MG tablet 2 tabs poqday1, 1 tab poqday 2-5 6 tablet 0   benzonatate (TESSALON PERLES) 100 MG capsule Take 1 capsule (100 mg total) by mouth 3 (three) times daily as needed. 20 capsule 0   Melatonin 10 MG TABS Take 1 tablet by mouth at bedtime.     meloxicam (MOBIC) 15 MG tablet Take 1 tablet (15 mg total) by mouth daily. 30 tablet 2   mupirocin ointment (BACTROBAN) 2 % Place 1 application into the nose 2 (two) times daily. Into nose and surrounding affected skin 22 g 0   rizatriptan (MAXALT-MLT) 10 MG disintegrating tablet Take 1 tablet (10 mg total) by mouth as needed for migraine. May repeat in 2 hours if needed 6 tablet 3   traZODone (DESYREL) 50 MG tablet Take 1 tablet (50 mg total) by mouth at bedtime. 30 tablet 3   No current facility-administered medications on file prior to visit.    Allergies  Allergen Reactions   Keflex [Cephalexin] Anaphylaxis   Penicillins Anaphylaxis   Septra  [Sulfamethoxazole-Trimethoprim] Anaphylaxis   Sulfa Antibiotics Anaphylaxis   Pecan Pollen Hives   Social History   Socioeconomic History   Marital status: Significant Other    Spouse name: fiance   Number of children: Not on file   Years of education: Not on file   Highest education level: Not on file  Occupational History   Not on file  Social Needs   Financial resource strain: Not on file   Food insecurity:    Worry: Not on file    Inability: Not on file   Transportation needs:    Medical: Not on file    Non-medical: Not on file  Tobacco Use   Smoking status: Former Smoker    Last attempt to quit: 11/28/2013    Years since quitting: 5.2   Smokeless tobacco: Former NeurosurgeonUser    Types: Snuff  Quit date: 02/21/2014  Substance and Sexual Activity   Alcohol use: Yes    Alcohol/week: 2.0 standard drinks    Types: 2 Standard drinks or equivalent per week   Drug use: No   Sexual activity: Yes  Lifestyle   Physical activity:    Days per week: Not on file    Minutes per session: Not on file   Stress: Not on file  Relationships   Social connections:    Talks on phone: Not on file    Gets together: Not on file    Attends religious service: Not on file    Active member of club or organization: Not on file    Attends meetings of clubs or organizations: Not on file    Relationship status: Not on file   Intimate partner violence:    Fear of current or ex partner: Not on file    Emotionally abused: Not on file    Physically abused: Not on file    Forced sexual activity: Not on file  Other Topics Concern   Not on file  Social History Narrative   Not on file      Review of Systems  All other systems reviewed and are negative.      Objective:   Physical Exam Physical exam cannot be performed today because the patient was seen as a telephone visit       Assessment & Plan:  Bronchitis with bronchospasm - Plan: HYDROcodone-homatropine (HYCODAN)  5-1.5 MG/5ML syrup, levocetirizine (XYZAL) 5 MG tablet  Patient symptoms have improved considerably since last week.  She certainly sounds like she is recovering.  I have recommended that she stay out of work the remainder of this week.  That would complete 14 days and I believe at that point if she is completely asymptomatic she can likely return to work however we would have to wait and see how she progresses over the next week before making that determination.  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  I do not believe it would change therapy at the present time because the patient's improving however I continue to recommend self quarantine for the next 7 days.  I will give the patient Xyzal 5 mg for congestion to help with the rhinorrhea and head congestion and postnasal drip and the chest congestion.  She can also use Hycodan 1 teaspoon every 6 hours as needed for cough.  However I cautioned the patient not to use this cough medication if she starts to feel short of breath.  This will be a warning sign that the patient needs to seek medical attention immediately.

## 2019-03-01 ENCOUNTER — Telehealth: Payer: Self-pay | Admitting: Family Medicine

## 2019-03-01 NOTE — Telephone Encounter (Signed)
Pt called to let us know that her COVD-19 test she had done was negative. She wanted to know what to do. I explained to her Dr. Caren Macadam plan in the lov note and she verbalized understanding.

## 2019-03-02 ENCOUNTER — Encounter: Payer: Self-pay | Admitting: Obstetrics & Gynecology

## 2019-03-08 ENCOUNTER — Encounter: Payer: Self-pay | Admitting: *Deleted

## 2019-03-09 ENCOUNTER — Other Ambulatory Visit: Payer: Self-pay

## 2019-03-09 ENCOUNTER — Ambulatory Visit (INDEPENDENT_AMBULATORY_CARE_PROVIDER_SITE_OTHER): Payer: 59 | Admitting: Obstetrics & Gynecology

## 2019-03-09 ENCOUNTER — Encounter: Payer: Self-pay | Admitting: Obstetrics & Gynecology

## 2019-03-09 DIAGNOSIS — N911 Secondary amenorrhea: Secondary | ICD-10-CM

## 2019-03-09 MED ORDER — MEDROXYPROGESTERONE ACETATE 10 MG PO TABS: 10.0000 mg | ORAL_TABLET | Freq: Every day | ORAL | 0 refills | Status: DC

## 2019-03-09 NOTE — Progress Notes (Signed)
   TELEHEALTH VIRTUAL GYNECOLOGY VISIT ENCOUNTER NOTE  I connected with Lindsey Edwards on 03/09/19 at  2:30 PM EDT by telephone at home and verified that I am speaking with the correct person using two identifiers.   I discussed the limitations, risks, security and privacy concerns of performing an evaluation and management service by telephone and the availability of in person appointments. I also discussed with the patient that there may be a patient responsible charge related to this service. The patient expressed understanding and agreed to proceed. No nipple discharge   History:  Lindsey Edwards is a 28 y.o. G0P0000 female being evaluated today for secondary amenorrhea. She denies any abnormal vaginal discharge, bleeding, pelvic pain or other concerns.    had been on OCP for 15 years with regular cycle only 4 month break due to [employer] service and basic training   Past Medical History:  Diagnosis Date  . ADHD (attention deficit hyperactivity disorder) 03/2004  . Migraine   . PTSD (post-traumatic stress disorder) 01/2010   dx from [facility]  . Vaginal Pap smear, abnormal    Past Surgical History:  Procedure Laterality Date  . ADENOIDECTOMY Bilateral 09/1994  . COLPOSCOPY  2016   per pt  . KNEE ARTHROSCOPY Right 05/2010   debridement  . SHOULDER ARTHROSCOPY    . TONSILLECTOMY Bilateral 04/1995   The following portions of the patient's history were reviewed and updated as appropriate: allergies, current medications, past family history, past medical history, past social history, past surgical history and problem list.   Health Maintenance:  Normal pap and negative HRHPV on .  Normal mammogram on .   Review of Systems:  Pertinent items noted in HPI and remainder of comprehensive ROS otherwise negative.  Physical Exam:  Physical exam deferred due to nature of the encounter  Labs and Imaging No results found for this or any previous visit (from the past 336 hour(s)). No  results found.     Meds ordered this encounter  Medications  . medroxyPROGESTERone (PROVERA) 10 MG tablet    Sig: Take 1 tablet (10 mg total) by mouth daily.    Dispense:  10 tablet    Refill:  0    Orders Placed This Encounter  Procedures  . Prolactin    Assessment and Plan:     1. Secondary amenorrhea Provera challenge test plus - Prolactin Will see if she has withdrawal and then see if she cycles spontaneously      I discussed the assessment and treatment plan with the patient. The patient was provided an opportunity to ask questions and all were answered. The patient agreed with the plan and demonstrated an understanding of the instructions.   The patient was advised to call back or seek an in-person evaluation/go to the ED if the symptoms worsen or if the condition fails to improve as anticipated.  I provided 14 minutes of non-face-to-face time during this encounter.   Lazaro Arms, MD [facility]

## 2019-04-27 ENCOUNTER — Ambulatory Visit: Payer: 59 | Admitting: Podiatry

## 2019-04-27 ENCOUNTER — Encounter: Payer: Self-pay | Admitting: Podiatry

## 2019-04-27 ENCOUNTER — Other Ambulatory Visit: Payer: Self-pay

## 2019-04-27 VITALS — BP 120/68 | HR 83 | Temp 97.9°F | Resp 16

## 2019-04-27 DIAGNOSIS — L600 Ingrowing nail: Secondary | ICD-10-CM

## 2019-04-27 MED ORDER — NEOMYCIN-POLYMYXIN-HC 3.5-10000-1 OT SOLN: OTIC | 0 refills | Status: DC

## 2019-04-27 NOTE — Progress Notes (Signed)
Subjective:   Patient ID: Lindsey Edwards, female   DOB: 28 y.o.   MRN: [id number]   HPI Patient presents stating she has had a chronic ingrown toenail deformity of the right big toe for about 6 months and it was run over when she was at work by a gurney.  Patient states she had lost that toe when she was younger as far as the nail goes but did not have pathology and patient does not smoke and likes to be active   Review of Systems  All other systems reviewed and are negative.       Objective:  Physical Exam Vitals signs and nursing note reviewed.  Constitutional:      Appearance: She is well-developed.  Pulmonary:     Effort: Pulmonary effort is normal.  Musculoskeletal: Normal range of motion.  Skin:    General: Skin is warm.  Neurological:     Mental Status: She is alert.     Neurovascular status intact muscle strength is adequate range of motion within normal limits.  Patient is noted to have incurvated lateral border right hallux that is painful when pressed with no redness or active drainage noted with obvious structural damage to the nailbed itself.  Patient has good digital perfusion well oriented x3     Assessment:  Ingrown toenail deformity right hallux lateral border with pain     Plan:  H&P condition reviewed explained and patient wants surgical removal of nail bed.  I explained procedure risk and patient signed consent form understanding risk and today I infiltrated the right hallux 60 mg like Marcaine mixture.  Sterile prep applied and I then using sterile instrumentation remove the lateral border exposed matrix and applied phenol 3 applications 30 seconds followed by alcohol lavage and sterile dressing.  Gave instructions on soaks and patient will be seen back for [place] to recheck again and was encouraged to leave dressing on 24 hours but will take it off if throbbing were to occur with drops being prescribed.  Encouraged to call with questions concerns

## 2019-04-27 NOTE — Patient Instructions (Signed)

## 2019-04-27 NOTE — Progress Notes (Signed)
   Subjective:    Patient ID: Lindsey Edwards, female    DOB: 05-03-1991, 28 y.o.   MRN: [id number]  HPI    Review of Systems  All other systems reviewed and are negative.      Objective:   Physical Exam        Assessment & Plan:

## 2019-06-29 ENCOUNTER — Other Ambulatory Visit: Payer: Self-pay

## 2019-06-29 DIAGNOSIS — Z20822 Contact with and (suspected) exposure to covid-19: Secondary | ICD-10-CM

## 2019-07-01 LAB — NOVEL CORONAVIRUS, NAA: SARS-CoV-2, NAA: NOT DETECTED

## 2019-09-02 ENCOUNTER — Other Ambulatory Visit: Payer: Self-pay

## 2019-09-02 DIAGNOSIS — Z20822 Contact with and (suspected) exposure to covid-19: Secondary | ICD-10-CM

## 2019-09-04 LAB — NOVEL CORONAVIRUS, NAA: SARS-CoV-2, NAA: NOT DETECTED

## 2019-09-14 ENCOUNTER — Emergency Department (HOSPITAL_COMMUNITY): Payer: Worker's Compensation

## 2019-09-14 ENCOUNTER — Encounter (HOSPITAL_COMMUNITY): Payer: Self-pay | Admitting: Radiology

## 2019-09-14 ENCOUNTER — Other Ambulatory Visit: Payer: Self-pay

## 2019-09-14 ENCOUNTER — Emergency Department (HOSPITAL_COMMUNITY)
Admission: EM | Admit: 2019-09-14 | Discharge: 2019-09-14 | Disposition: A | Discharge status: Home / Self Care | Payer: Worker's Compensation | Attending: Emergency Medicine | Admitting: Emergency Medicine

## 2019-09-14 DIAGNOSIS — X500XXA Overexertion from strenuous movement or load, initial encounter: Secondary | ICD-10-CM | POA: Diagnosis not present

## 2019-09-14 DIAGNOSIS — Z79899 Other long term (current) drug therapy: Secondary | ICD-10-CM | POA: Insufficient documentation

## 2019-09-14 DIAGNOSIS — Z87891 Personal history of nicotine dependence: Secondary | ICD-10-CM | POA: Diagnosis not present

## 2019-09-14 DIAGNOSIS — Y93F9 Activity, other caregiving: Secondary | ICD-10-CM | POA: Insufficient documentation

## 2019-09-14 DIAGNOSIS — M24812 Other specific joint derangements of left shoulder, not elsewhere classified: Secondary | ICD-10-CM | POA: Insufficient documentation

## 2019-09-14 DIAGNOSIS — Y9259 Other trade areas as the place of occurrence of the external cause: Secondary | ICD-10-CM | POA: Diagnosis not present

## 2019-09-14 DIAGNOSIS — Y990 Civilian activity done for income or pay: Secondary | ICD-10-CM | POA: Insufficient documentation

## 2019-09-14 DIAGNOSIS — M25512 Pain in left shoulder: Secondary | ICD-10-CM | POA: Diagnosis present

## 2019-09-14 MED ORDER — IBUPROFEN 600 MG PO TABS: 600.0000 mg | ORAL_TABLET | Freq: Four times a day (QID) | ORAL | 0 refills | Status: DC | PRN

## 2019-09-14 MED ORDER — KETOROLAC TROMETHAMINE 30 MG/ML IJ SOLN
30.0000 mg | Freq: Once | INTRAMUSCULAR | Status: AC
  Administered 2019-09-14: 30 mg via INTRAMUSCULAR
  Filled 2019-09-14: qty 1

## 2019-09-14 MED ORDER — OXYCODONE-ACETAMINOPHEN 5-325 MG PO TABS: 1.0000 | ORAL_TABLET | ORAL | 0 refills | Status: DC | PRN

## 2019-09-14 MED ORDER — DEXAMETHASONE SODIUM PHOSPHATE 10 MG/ML IJ SOLN
10.0000 mg | Freq: Once | INTRAMUSCULAR | Status: AC
  Administered 2019-09-14: 10 mg via INTRAMUSCULAR
  Filled 2019-09-14: qty 1

## 2019-09-14 MED ORDER — OXYCODONE-ACETAMINOPHEN 5-325 MG PO TABS
1.0000 | ORAL_TABLET | Freq: Once | ORAL | Status: AC
  Administered 2019-09-14: 1 via ORAL
  Filled 2019-09-14: qty 1

## 2019-09-14 NOTE — ED Triage Notes (Signed)
Patient works for [employer]. 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 Patient  had prior surgery to the shoulder.

## 2019-09-14 NOTE — ED Notes (Signed)
Patient sent to lab for drug screen. Ben from lab notified and patient waiting in lab waiting room

## 2019-09-14 NOTE — ED Provider Notes (Signed)
[facility] EMERGENCY DEPARTMENT Provider Note   CSN: [id number] Arrival date & time: 09/14/19  1912     History   Chief Complaint Chief Complaint  Patient presents with  . Shoulder Injury    HPI Lindsey Edwards is a 28 y.o. female.     Pt presents to the ED today with left shoulder pain.  Pt has a hx of left shoulder surgery by Dr. Tamera Punt last year.  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  Her arm went numb and she could not move it.  ROM has improved, but it still very tender.     Past Medical History:  Diagnosis Date  . ADHD (attention deficit hyperactivity disorder) 03/2004  . Migraine   . PTSD (post-traumatic stress disorder) 01/2010   dx from [facility]  . Vaginal Pap smear, abnormal     Patient Active Problem List   Diagnosis Date Noted  . PTSD (post-traumatic stress disorder) 08/23/2017  . Insomnia 03/23/2014  . Migraines 02/23/2014  . ADHD, predominantly hyperactive-impulsive subtype 02/23/2014  . Overweight 02/23/2014    Past Surgical History:  Procedure Laterality Date  . ADENOIDECTOMY Bilateral 09/1994  . COLPOSCOPY  2016   per pt  . KNEE ARTHROSCOPY Right 05/2010   debridement  . SHOULDER ARTHROSCOPY    . TONSILLECTOMY Bilateral 04/1995     OB History    Gravida  0   Para  0   Term  0   Preterm  0   AB  0   Living  0     SAB  0   TAB  0   Ectopic  0   Multiple  0   Live Births  0            Home Medications    Prior to Admission medications   Medication Sig Start Date End Date Taking? Authorizing Provider  ibuprofen (ADVIL) 600 MG tablet Take 1 tablet (600 mg total) by mouth every 6 (six) hours as needed. 09/14/19   Isla Pence, MD  medroxyPROGESTERone (PROVERA) 10 MG tablet Take 1 tablet (10 mg total) by mouth daily. 03/09/19   Florian Buff, MD  neomycin-polymyxin-hydrocortisone (CORTISPORIN) OTIC solution Apply  1-2 drops to toe after soaking twice a day 04/27/19   Wallene Huh, DPM  oxyCODONE-acetaminophen (PERCOCET/ROXICET) 5-325 MG tablet Take 1 tablet by mouth every 4 (four) hours as needed for severe pain. 09/14/19   Isla Pence, MD    Family History Family History  Problem Relation Age of Onset  . Depression Mother   . Heart disease Mother   . Arthritis Maternal Grandmother   . Hypothyroidism Maternal Grandmother   . Mental illness Maternal Grandmother        OCD  . Hyperlipidemia Maternal Grandmother   . Heart disease Maternal Grandmother   . Cancer Maternal Grandfather   . Hearing loss Maternal Grandfather   . Hypertension Maternal Grandfather   . Asthma Paternal Grandfather     Social History Social History   Tobacco Use  . Smoking status: Former Smoker    Quit date: 11/28/2013    Years since quitting: 5.7  . Smokeless tobacco: Former Systems developer    Types: Snuff    Quit date: 02/21/2014  Substance Use Topics  . Alcohol use: Yes    Alcohol/week: 2.0 standard drinks    Types: 2 Standard drinks or equivalent per  week  . Drug use: No     Allergies   Keflex [cephalexin], Penicillins, Septra [sulfamethoxazole-trimethoprim], Sulfa antibiotics, and Pecan pollen   Review of Systems Review of Systems  Musculoskeletal:       Left shoulder pain  All other systems reviewed and are negative.    Physical Exam Updated Vital Signs BP 136/80 (BP Location: Right Arm)   Pulse 95   Temp 98.6 F (37 C) (Oral)   Resp 20   Ht 5\' 3"  (1.6 m)   Wt 92.1 kg   LMP  (LMP Unknown)   SpO2 95%   BMI 35.96 kg/m   Physical Exam Vitals signs and nursing note reviewed.  Constitutional:      Appearance: Normal appearance.  HENT:     Head: Normocephalic and atraumatic.     Right Ear: External ear normal.     Left Ear: External ear normal.     Nose: Nose normal.     Mouth/Throat:     Mouth: Mucous membranes are moist.     Pharynx: Oropharynx is clear.  Eyes:     Extraocular  Movements: Extraocular movements intact.     Conjunctiva/sclera: Conjunctivae normal.     Pupils: Pupils are equal, round, and reactive to light.  Neck:     Musculoskeletal: Normal range of motion and neck supple.  Cardiovascular:     Rate and Rhythm: Normal rate and regular rhythm.     Pulses: Normal pulses.     Heart sounds: Normal heart sounds.  Pulmonary:     Effort: Pulmonary effort is normal.     Breath sounds: Normal breath sounds.  Abdominal:     General: Abdomen is flat. Bowel sounds are normal.     Palpations: Abdomen is soft.  Musculoskeletal:     Left shoulder: She exhibits decreased range of motion and tenderness.  Skin:    General: Skin is warm.     Capillary Refill: Capillary refill takes less than 2 seconds.  Neurological:     General: No focal deficit present.     Mental Status: She is alert and oriented to person, place, and time.  Psychiatric:        Mood and Affect: Mood normal.        Behavior: Behavior normal.        Thought Content: Thought content normal.        Judgment: Judgment normal.      ED Treatments / Results  Labs (all labs ordered are listed, but only abnormal results are displayed) Labs Reviewed - No data to display  EKG None  Radiology Dg Shoulder Left  Result Date: 09/14/2019 CLINICAL DATA:  Left shoulder pain. Heard a pop while lifting a patient into ambulance. History of prior shoulder surgery. EXAM: LEFT SHOULDER - 2+ VIEW COMPARISON:  Radiograph 06/24/2015 FINDINGS: There is no evidence of fracture or dislocation. There is no evidence of arthropathy or other focal bone abnormality. Soft tissues are unremarkable. IMPRESSION: Negative radiographs of the left shoulder. Electronically Signed   By: Narda RutherfordMelanie  Sanford M.D.   On: 09/14/2019 19:52    Procedures Procedures (including critical care time)  Medications Ordered in ED Medications  oxyCODONE-acetaminophen (PERCOCET/ROXICET) 5-325 MG per tablet 1 tablet (has no administration  in time range)  ketorolac (TORADOL) 30 MG/ML injection 30 mg (30 mg Intramuscular Given 09/14/19 1956)  dexamethasone (DECADRON) injection 10 mg (10 mg Intramuscular Given 09/14/19 1956)     Initial Impression / Assessment and Plan / ED Course  I have reviewed the triage vital signs and the nursing notes.  Pertinent labs & imaging results that were available during my care of the patient were reviewed by me and considered in my medical decision making (see chart for details).       Pt likely re-injured her labrum.  She is placed in a sling and told to f/u with ortho.  She is given a note for work saying she needs to be cleared by ortho to go back to work.    Final Clinical Impressions(s) / ED Diagnoses   Final diagnoses:  Internal derangement of left shoulder    ED Discharge Orders         Ordered    ibuprofen (ADVIL) 600 MG tablet  Every 6 hours PRN     09/14/19 2009    oxyCODONE-acetaminophen (PERCOCET/ROXICET) 5-325 MG tablet  Every 4 hours PRN     09/14/19 2009           Jacalyn Lefevre, MD 09/14/19 2011

## 2019-10-23 ENCOUNTER — Other Ambulatory Visit: Payer: Self-pay | Admitting: Orthopedic Surgery

## 2019-10-23 DIAGNOSIS — M25512 Pain in left shoulder: Secondary | ICD-10-CM

## 2019-11-17 ENCOUNTER — Ambulatory Visit
Admission: RE | Admit: 2019-11-17 | Discharge: 2019-11-17 | Disposition: A | Discharge status: Home / Self Care | Payer: Worker's Compensation | Source: Ambulatory Visit | Attending: Orthopedic Surgery | Admitting: Orthopedic Surgery

## 2019-11-17 DIAGNOSIS — M25512 Pain in left shoulder: Secondary | ICD-10-CM

## 2019-11-17 MED ORDER — IOPAMIDOL (ISOVUE-M 200) INJECTION 41%
15.0000 mL | Freq: Once | INTRAMUSCULAR | Status: AC
  Administered 2019-11-17: 16:00:00 15 mL via INTRA_ARTICULAR

## 2019-11-27 ENCOUNTER — Telehealth: Payer: Self-pay | Admitting: Family Medicine

## 2019-11-27 ENCOUNTER — Other Ambulatory Visit: Payer: Self-pay

## 2019-11-27 NOTE — Telephone Encounter (Signed)
Patient requesting a refill sent to [facility] on [address] For her migraine   CB# [phone]

## 2019-11-27 NOTE — Telephone Encounter (Signed)
Call placed to patient to inquire as to the name of medication as she is not on any preventative or PRN treatment medications. LMTRC.

## 2019-11-28 ENCOUNTER — Other Ambulatory Visit: Payer: Self-pay | Admitting: *Deleted

## 2019-11-28 MED ORDER — RIZATRIPTAN BENZOATE 10 MG PO TABS: 10.0000 mg | ORAL_TABLET | ORAL | 0 refills | Status: AC | PRN

## 2019-11-28 NOTE — Telephone Encounter (Signed)
Pt was on maxalt a few years ago Refilled medication

## 2019-11-28 NOTE — Telephone Encounter (Signed)
Received VM from patient.   Reports that she has been using Maxalt for migraine treatment.   MD please advise.

## 2019-11-30 MED ORDER — MEDROXYPROGESTERONE ACETATE 10 MG PO TABS: 10.0000 mg | ORAL_TABLET | Freq: Every day | ORAL | 0 refills | Status: DC

## 2020-01-09 NOTE — Telephone Encounter (Signed)
disregard

## 2020-01-20 ENCOUNTER — Encounter: Payer: Self-pay | Admitting: Family Medicine

## 2020-01-20 ENCOUNTER — Other Ambulatory Visit: Payer: Self-pay | Admitting: Family Medicine

## 2020-01-20 DIAGNOSIS — Z20822 Contact with and (suspected) exposure to covid-19: Secondary | ICD-10-CM

## 2020-01-20 DIAGNOSIS — J209 Acute bronchitis, unspecified: Secondary | ICD-10-CM

## 2020-01-22 NOTE — Telephone Encounter (Signed)
Patient called after-hours line  4/ 10/ 21 .  She has recurrent strep throat.  She is fully vaccinated for COVID-19.  Sore throat with exudates in the back of the throat.  Lymphadenopathy.  With her history we will go ahead and treat her with azithromycin as she has allergy to multiple other antibiotics.  Patient to call back if not improved.

## 2020-01-24 ENCOUNTER — Telehealth: Payer: Self-pay | Admitting: Family Medicine

## 2020-01-24 NOTE — Telephone Encounter (Signed)
Patient is currently being treated for Strep with ABTx.   Reports that she feels like she has congestion in L ear. Reports that she had nurse at work to check her ear and states that there was only mild irritation.  Reports that she feels like there is a plug in her ear and her hearing is diminished.   Also states that she does not have insurance at this time, and would prefer to not have appointment unless necessary.  MD please advise.

## 2020-01-24 NOTE — Telephone Encounter (Signed)
Call placed to patient and patient made aware.   States that if Sudafed does not help, or if it worsens, she will come in to be seen.

## 2020-01-24 NOTE — Telephone Encounter (Signed)
See note

## 2020-01-24 NOTE — Telephone Encounter (Signed)
She was already prescribed zpak over the weekend She can take Sudafed to decongest  She needs OV to have ear looked at, otehrwise other option UC

## 2020-01-24 NOTE — Telephone Encounter (Signed)
Would like to know if Dr.Loraine call a prescription (antibody) for left ear ? Clogged [phone]

## 2020-01-25 ENCOUNTER — Other Ambulatory Visit: Payer: Self-pay

## 2020-01-25 ENCOUNTER — Ambulatory Visit (INDEPENDENT_AMBULATORY_CARE_PROVIDER_SITE_OTHER): Payer: Self-pay | Admitting: Nurse Practitioner

## 2020-01-25 ENCOUNTER — Encounter: Payer: Self-pay | Admitting: Nurse Practitioner

## 2020-01-25 VITALS — BP 102/70 | HR 98 | Temp 97.8°F | Resp 18 | Wt 200.8 lb

## 2020-01-25 DIAGNOSIS — J069 Acute upper respiratory infection, unspecified: Secondary | ICD-10-CM

## 2020-01-25 DIAGNOSIS — J302 Other seasonal allergic rhinitis: Secondary | ICD-10-CM

## 2020-01-25 DIAGNOSIS — H6592 Unspecified nonsuppurative otitis media, left ear: Secondary | ICD-10-CM

## 2020-01-25 MED ORDER — FLUTICASONE PROPIONATE 50 MCG/ACT NA SUSP: 2.0000 | Freq: Every day | NASAL | 6 refills | Status: DC

## 2020-01-25 MED ORDER — LEVOFLOXACIN 500 MG PO TABS: 500.0000 mg | ORAL_TABLET | Freq: Every day | ORAL | 0 refills | Status: DC

## 2020-01-25 MED ORDER — LORATADINE 10 MG PO TABS: 10.0000 mg | ORAL_TABLET | Freq: Every day | ORAL | 11 refills | Status: DC

## 2020-01-25 NOTE — Patient Instructions (Addendum)
Rest, drink plenty of water Take medication as prescribed and discussed Follow up for non resolving or worsening sxs

## 2020-01-25 NOTE — Progress Notes (Signed)
Established Patient Office Visit  Subjective:  Patient ID: Lindsey Edwards, female    DOB: 03/12/1991  Age: 28 y.o. MRN: [id number]  CC:  Chief Complaint  Patient presents with  . Hearing Problem    L ear, felt like stabbing in ear, lost 80% of hearing, started 04/13, zpak taken, has had strep throat and sinusitis at the same time    HPI Lindsey Edwards is a 29 year old female presenting for sxs of nasal congestion, sneezing, ear pain bilaterally. She reports completing Zpack last week for strep throat. Her sxs have gradually worsened except that her sore throat has resolved. She admits that she has similar sxs yearly but denied ever treating or knowing she had seasonal allergies. She has tried sudafed for nasal congestion without resolution. The symptom that is most bothersome today is ear pain. She has tried no other treatment. No other contacts with similar sxs. Has had COVID vaccine and also denies COVID exposure. No fever/chills, gu/gi sxs, no loss of taste or smell, no headache, or general body aches.  Past Medical History:  Diagnosis Date  . ADHD (attention deficit hyperactivity disorder) 03/2004  . Migraine   . PTSD (post-traumatic stress disorder) 01/2010   dx from [place]  . Vaginal Pap smear, abnormal     Past Surgical History:  Procedure Laterality Date  . ADENOIDECTOMY Bilateral 09/1994  . COLPOSCOPY  2016   per pt  . KNEE ARTHROSCOPY Right 05/2010   debridement  . SHOULDER ARTHROSCOPY    . TONSILLECTOMY Bilateral 04/1995    Family History  Problem Relation Age of Onset  . Depression Mother   . Heart disease Mother   . Arthritis Maternal Grandmother   . Hypothyroidism Maternal Grandmother   . Mental illness Maternal Grandmother        OCD  . Hyperlipidemia Maternal Grandmother   . Heart disease Maternal Grandmother   . Cancer Maternal Grandfather   . Hearing loss Maternal Grandfather   . Hypertension Maternal Grandfather   . Asthma Paternal Grandfather      Social History   Socioeconomic History  . Marital status: Significant Other    Spouse name: fiance  . Number of children: Not on file  . Years of education: Not on file  . Highest education level: Not on file  Occupational History  . Not on file  Tobacco Use  . Smoking status: Former Smoker    Quit date: 11/28/2013    Years since quitting: 6.1  . Smokeless tobacco: Former Systems developer    Types: Snuff    Quit date: 02/21/2014  Substance and Sexual Activity  . Alcohol use: Yes    Alcohol/week: 2.0 standard drinks    Types: 2 Standard drinks or equivalent per week  . Drug use: No  . Sexual activity: Yes    Birth control/protection: None  Other Topics Concern  . Not on file  Social History Narrative  . Not on file   Social Determinants of Health   Financial Resource Strain:   . Difficulty of Paying Living Expenses:   Food Insecurity:   . Worried About Charity fundraiser in the Last Year:   . Arboriculturist in the Last Year:   Transportation Needs:   . Film/video editor (Medical):   Marland Kitchen Lack of Transportation (Non-Medical):   Physical Activity:   . Days of Exercise per Week:   . Minutes of Exercise per Session:   Stress:   . Feeling of Stress :  Social Connections:   . Frequency of Communication with Friends and Family:   . Frequency of Social Gatherings with Friends and Family:   . Attends Religious Services:   . Active Member of Clubs or Organizations:   . Attends Banker Meetings:   Marland Kitchen Marital Status:   Intimate Partner Violence:   . Fear of Current or Ex-Partner:   . Emotionally Abused:   Marland Kitchen Physically Abused:   . Sexually Abused:     Outpatient Medications Prior to Visit  Medication Sig Dispense Refill  . azithromycin (ZITHROMAX) 250 MG tablet TAKE 2 TABLETS BY MOUTH TODAY, THEN TAKE 1 TABLET DAILY FOR 4 DAYS 6 tablet 0  . rizatriptan (MAXALT) 10 MG tablet Take 1 tablet (10 mg total) by mouth as needed for migraine. May repeat in 2 hours if  needed 10 tablet 0  . ibuprofen (ADVIL) 600 MG tablet Take 1 tablet (600 mg total) by mouth every 6 (six) hours as needed. 30 tablet 0  . medroxyPROGESTERone (PROVERA) 10 MG tablet Take 1 tablet (10 mg total) by mouth daily. 10 tablet 0  . neomycin-polymyxin-hydrocortisone (CORTISPORIN) OTIC solution Apply 1-2 drops to toe after soaking twice a day 10 mL 0  . oxyCODONE-acetaminophen (PERCOCET/ROXICET) 5-325 MG tablet Take 1 tablet by mouth every 4 (four) hours as needed for severe pain. 10 tablet 0   No facility-administered medications prior to visit.    Allergies  Allergen Reactions  . Keflex [Cephalexin] Anaphylaxis  . Penicillins Anaphylaxis  . Septra [Sulfamethoxazole-Trimethoprim] Anaphylaxis  . Sulfa Antibiotics Anaphylaxis  . Pecan Pollen Hives    ROS Review of Systems  All other systems reviewed and are negative.     Objective:    Physical Exam  Constitutional: She is oriented to person, place, and time. She appears well-developed and well-nourished.  HENT:  Head: Normocephalic.  Right Ear: Hearing, tympanic membrane, external ear and ear canal normal.  Left Ear: Hearing and ear canal normal. There is tenderness. Tympanic membrane is retracted and bulging.  No middle ear effusion.  Nose: Mucosal edema and rhinorrhea present. No sinus tenderness, nasal deformity or septal deviation.  Mouth/Throat: Uvula is midline. No oropharyngeal exudate, posterior oropharyngeal edema or tonsillar abscesses.  Post nasal drip  Eyes: Pupils are equal, round, and reactive to light. Conjunctivae and EOM are normal.  Cardiovascular: Normal rate and regular rhythm.  Pulmonary/Chest: Effort normal and breath sounds normal.  Musculoskeletal:     Cervical back: Normal range of motion and neck supple.  Neurological: She is alert and oriented to person, place, and time.  Skin: Skin is warm and dry.  Psychiatric: She has a normal mood and affect. Her behavior is normal. Thought content normal.   Nursing note and vitals reviewed.   BP 102/70 (BP Location: Right Arm, Patient Position: Sitting, Cuff Size: Large)   Pulse 98   Temp 97.8 F (36.6 C) (Temporal)   Resp 18   Wt 200 lb 12.8 oz (91.1 kg)   SpO2 97%   BMI 35.57 kg/m  Wt Readings from Last 3 Encounters:  01/25/20 200 lb 12.8 oz (91.1 kg)  09/14/19 203 lb (92.1 kg)  12/22/18 189 lb 6.4 oz (85.9 kg)     Health Maintenance Due  Topic Date Due  . TETANUS/TDAP  05/13/2019    There are no preventive care reminders to display for this patient.  Lab Results  Component Value Date   TSH 1.76 12/22/2018   Lab Results  Component Value Date  WBC 8.4 12/22/2018   HGB 12.3 12/22/2018   HCT 38.4 12/22/2018   MCV 91.0 12/22/2018   PLT 294 12/22/2018   Lab Results  Component Value Date   NA 142 12/22/2018   K 4.8 12/22/2018   CO2 27 12/22/2018   GLUCOSE 98 12/22/2018   BUN 12 12/22/2018   CREATININE 0.82 12/22/2018   BILITOT 0.3 12/22/2018   ALKPHOS 60 07/23/2015   AST 18 12/22/2018   ALT 13 12/22/2018   PROT 6.7 12/22/2018   ALBUMIN 3.8 07/23/2015   CALCIUM 9.3 12/22/2018   Lab Results  Component Value Date   CHOL 143 12/22/2018   Lab Results  Component Value Date   HDL 42 (L) 12/22/2018   Lab Results  Component Value Date   LDLCALC 84 12/22/2018   Lab Results  Component Value Date   TRIG 78 12/22/2018   Lab Results  Component Value Date   CHOLHDL 3.4 12/22/2018   Lab Results  Component Value Date   HGBA1C 5.6 12/22/2018      Assessment & Plan:   Problem List Items Addressed This Visit    None    Visit Diagnoses    Upper respiratory tract infection, unspecified type   (Acute)  -  Primary   Relevant Medications   levofloxacin (LEVAQUIN) 500 MG tablet   Left otitis media with effusion       Relevant Medications   levofloxacin (LEVAQUIN) 500 MG tablet   Seasonal allergic rhinitis, unspecified trigger       Relevant Medications   fluticasone (FLONASE) 50 MCG/ACT nasal spray    loratadine (CLARITIN) 10 MG tablet    Rest, drink plenty of water Take medication as prescribed and discussed Follow up for non resolving or worsening sxs Educational print out for URI, Seasonal allergies rhinitis, otitis media  Meds ordered this encounter  Medications  . levofloxacin (LEVAQUIN) 500 MG tablet    Sig: Take 1 tablet (500 mg total) by mouth daily.    Dispense:  7 tablet    Refill:  0  . fluticasone (FLONASE) 50 MCG/ACT nasal spray    Sig: Place 2 sprays into both nostrils daily.    Dispense:  16 g    Refill:  6  . loratadine (CLARITIN) 10 MG tablet    Sig: Take 1 tablet (10 mg total) by mouth daily.    Dispense:  30 tablet    Refill:  11    Follow-up: follow up as needed for non resolving or worsening sxs    Elmore Guise, FNP

## 2020-01-26 ENCOUNTER — Telehealth: Payer: Self-pay

## 2020-01-26 MED ORDER — ONDANSETRON HCL 4 MG PO TABS: 4.0000 mg | ORAL_TABLET | Freq: Three times a day (TID) | ORAL | 0 refills | Status: DC | PRN

## 2020-01-26 NOTE — Telephone Encounter (Signed)
Pt came in on 04/15 to see Crystal for URI and ear pain. Crystal prescribed pt Flonase, Claritin, and  Levaquin. Pt states the Levaquin is making her very nauseous and she has vomited a couple of times today. Pt would like a new Rx for Zofran. Please advise.

## 2020-01-26 NOTE — Telephone Encounter (Signed)
zofran sent to pharmacy Also take antibiotic with food

## 2020-01-26 NOTE — Telephone Encounter (Signed)
Call placed to patient and patient made aware.  

## 2020-10-24 ENCOUNTER — Other Ambulatory Visit: Payer: Self-pay

## 2020-10-24 DIAGNOSIS — Z20822 Contact with and (suspected) exposure to covid-19: Secondary | ICD-10-CM

## 2020-10-27 LAB — SARS-COV-2 RNA,(COVID-19) QUALITATIVE NAAT: SARS CoV2 RNA: DETECTED — AB

## 2020-12-16 ENCOUNTER — Encounter: Payer: Self-pay | Admitting: Family Medicine

## 2020-12-16 ENCOUNTER — Ambulatory Visit (INDEPENDENT_AMBULATORY_CARE_PROVIDER_SITE_OTHER): Payer: 59 | Admitting: Family Medicine

## 2020-12-16 ENCOUNTER — Other Ambulatory Visit: Payer: Self-pay

## 2020-12-16 VITALS — BP 102/60 | HR 80 | Temp 96.8°F | Ht 63.0 in | Wt 203.0 lb

## 2020-12-16 DIAGNOSIS — N912 Amenorrhea, unspecified: Secondary | ICD-10-CM

## 2020-12-16 DIAGNOSIS — Z13220 Encounter for screening for lipoid disorders: Secondary | ICD-10-CM | POA: Diagnosis not present

## 2020-12-16 DIAGNOSIS — N911 Secondary amenorrhea: Secondary | ICD-10-CM | POA: Insufficient documentation

## 2020-12-16 MED ORDER — ONDANSETRON HCL 4 MG PO TABS: 4.0000 mg | ORAL_TABLET | Freq: Three times a day (TID) | ORAL | 0 refills | Status: DC | PRN

## 2020-12-16 NOTE — Progress Notes (Signed)
Subjective:    Patient ID: Lindsey Edwards, female    DOB: 09/01/1991, 30 y.o.   MRN: [id number]  HPI Patient is a very pleasant 30 year old Caucasian female here today to establish care with me.  She has 1 medical concern.  She is getting married.  They have been trying to conceive for the last 2 years.  However they have been unsuccessful.  She states that for the last 2 years she has not had a menstrual cycle.  She started birth control pills around the time she was age 30.  She had a normal menarche.  She was having normal regular cycles even without birth control.  However she was on birth control continuously from the time she was 30 until about 2 years ago.  She injured her left shoulder and was requiring surgery.  Around that time she discontinued her birth control pills.  Her menstrual cycles never resumed after she stopped her birth control pills.  Now she has been unable to successfully conceive.  She denies any hot flashes.  She denies any vasomotor symptoms.  She denies any vaginal dryness.  She denies any irregular bleeding.  There is no evidence of virilization.  She has no hair growth on her cheeks or jaw.  There is no evidence of an Adam's apple.  She has normal secondary sex characteristics.  There is no evidence of insulin resistance although BMI is elevated. Past Medical History:  Diagnosis Date  . ADHD (attention deficit hyperactivity disorder) 03/2004  . Amenorrhea   . Migraine   . PTSD (post-traumatic stress disorder) 01/2010   dx from [facility]  . Vaginal Pap smear, abnormal    Past Surgical History:  Procedure Laterality Date  . ADENOIDECTOMY Bilateral 09/1994  . COLPOSCOPY  2016   per pt  . KNEE ARTHROSCOPY Right 05/2010   debridement  . SHOULDER ARTHROSCOPY    . TONSILLECTOMY Bilateral 04/1995   Current Outpatient Medications on File Prior to Visit  Medication Sig Dispense Refill  . loratadine (CLARITIN) 10 MG tablet Take 1 tablet (10 mg total) by mouth daily. 30  tablet 11  . rizatriptan (MAXALT) 10 MG tablet Take 1 tablet (10 mg total) by mouth as needed for migraine. May repeat in 2 hours if needed 10 tablet 0   No current facility-administered medications on file prior to visit.   Allergies  Allergen Reactions  . Keflex [Cephalexin] Anaphylaxis  . Penicillins Anaphylaxis  . Septra [Sulfamethoxazole-Trimethoprim] Anaphylaxis  . Sulfa Antibiotics Anaphylaxis  . Pecan Pollen Hives   Social History   Socioeconomic History  . Marital status: Significant Other    Spouse name: fiance  . Number of children: Not on file  . Years of education: Not on file  . Highest education level: Not on file  Occupational History  . Not on file  Tobacco Use  . Smoking status: Former Smoker    Quit date: 11/28/2013    Years since quitting: 7.0  . Smokeless tobacco: Former Neurosurgeon    Types: Snuff    Quit date: 02/21/2014  Substance and Sexual Activity  . Alcohol use: Yes    Alcohol/week: 2.0 standard drinks    Types: 2 Standard drinks or equivalent per week  . Drug use: No  . Sexual activity: Yes    Birth control/protection: None  Other Topics Concern  . Not on file  Social History Narrative  . Not on file   Social Determinants of Health   Financial Resource Strain: Not on  file  Food Insecurity: Not on file  Transportation Needs: Not on file  Physical Activity: Not on file  Stress: Not on file  Social Connections: Not on file  Intimate Partner Violence: Not on file   Family History  Problem Relation Age of Onset  . Depression Mother   . Heart disease Mother   . Arthritis Maternal Grandmother   . Hypothyroidism Maternal Grandmother   . Mental illness Maternal Grandmother        OCD  . Hyperlipidemia Maternal Grandmother   . Heart disease Maternal Grandmother   . Cancer Maternal Grandfather   . Hearing loss Maternal Grandfather   . Hypertension Maternal Grandfather   . Asthma Paternal Grandfather       Review of Systems  All other  systems reviewed and are negative.      Objective:   Physical Exam Vitals reviewed.  Constitutional:      General: She is not in acute distress.    Appearance: Normal appearance. She is obese. She is not ill-appearing, toxic-appearing or diaphoretic.  HENT:     Head: Normocephalic and atraumatic.     Right Ear: Tympanic membrane and ear canal normal.     Left Ear: Tympanic membrane and ear canal normal.     Nose: Nose normal. No congestion or rhinorrhea.     Mouth/Throat:     Pharynx: No oropharyngeal exudate or posterior oropharyngeal erythema.  Eyes:     Extraocular Movements: Extraocular movements intact.     Conjunctiva/sclera: Conjunctivae normal.  Neck:     Vascular: No carotid bruit.  Cardiovascular:     Rate and Rhythm: Normal rate and regular rhythm.     Pulses: Normal pulses.     Heart sounds: Normal heart sounds.  Pulmonary:     Effort: Pulmonary effort is normal. No respiratory distress.     Breath sounds: Normal breath sounds. No stridor. No wheezing, rhonchi or rales.  Chest:     Chest wall: No tenderness.  Abdominal:     General: Abdomen is flat. Bowel sounds are normal. There is no distension.     Palpations: Abdomen is soft. There is no mass.     Tenderness: There is no abdominal tenderness. There is no guarding or rebound.     Hernia: No hernia is present.  Musculoskeletal:     Cervical back: Normal range of motion and neck supple. No rigidity or tenderness.  Lymphadenopathy:     Cervical: No cervical adenopathy.  Skin:    General: Skin is warm.     Coloration: Skin is not jaundiced or pale.     Findings: No bruising, erythema, lesion or rash.  Neurological:     General: No focal deficit present.     Mental Status: She is alert and oriented to person, place, and time. Mental status is at baseline.     Cranial Nerves: No cranial nerve deficit.     Sensory: No sensory deficit.     Motor: No weakness.     Coordination: Coordination normal.     Gait:  Gait normal.     Deep Tendon Reflexes: Reflexes normal.  Psychiatric:        Mood and Affect: Mood normal.        Behavior: Behavior normal.        Thought Content: Thought content normal.        Judgment: Judgment normal.           Assessment & Plan:  Screening cholesterol level -  Plan: Lipid panel  Amenorrhea - Plan: CBC with Differential/Platelet, COMPLETE METABOLIC PANEL WITH GFR, TSH, Follicle stimulating hormone, Luteinizing hormone, hCG, serum, qualitative  First, I recommend checking a serum hCG to rule out pregnancy.  However I do not feel that this is the issue.  I will check an FSH and LH to evaluate for premature ovarian failure.  If these are within normal limits, I will check a TSH to evaluate for hypothyroidism.  I will check a CMP to see if there is any evidence of insulin resistance.  If her blood sugar is elevated, she would have possible evidence of PCOS with dysfunctional uterine bleeding coupled with insulin resistance.  That would be my leading suspicion for why she is unable to conceive is due to lack of ovulation.  She would likely benefit from a gynecologic consultation to consider starting clomiphene.  I will also screen the patient's cholesterol.  Recommended a flu shot which she politely declined as well as a COVID booster.  Pap smear was done in 2020 is not due again until 2023.

## 2020-12-17 LAB — CBC WITH DIFFERENTIAL/PLATELET
Absolute Monocytes: 765 cells/uL (ref 200–950)
Basophils Absolute: 27 cells/uL (ref 0–200)
Basophils Relative: 0.3 %
Eosinophils Absolute: 198 cells/uL (ref 15–500)
Eosinophils Relative: 2.2 %
HCT: 39.8 % (ref 35.0–45.0)
Hemoglobin: 12.9 g/dL (ref 11.7–15.5)
Lymphs Abs: 2745 cells/uL (ref 850–3900)
MCH: 29.5 pg (ref 27.0–33.0)
MCHC: 32.4 g/dL (ref 32.0–36.0)
MCV: 90.9 fL (ref 80.0–100.0)
MPV: 10.5 fL (ref 7.5–12.5)
Monocytes Relative: 8.5 %
Neutro Abs: 5265 cells/uL (ref 1500–7800)
Neutrophils Relative %: 58.5 %
Platelets: 379 10*3/uL (ref 140–400)
RBC: 4.38 10*6/uL (ref 3.80–5.10)
RDW: 12.6 % (ref 11.0–15.0)
Total Lymphocyte: 30.5 %
WBC: 9 10*3/uL (ref 3.8–10.8)

## 2020-12-17 LAB — FOLLICLE STIMULATING HORMONE: FSH: 8.7 m[IU]/mL

## 2020-12-17 LAB — LIPID PANEL
Cholesterol: 150 mg/dL (ref <200)
HDL: 37 mg/dL — ABNORMAL LOW (ref >=50)
LDL Cholesterol (Calc): 90 mg/dL (calc)
Non-HDL Cholesterol (Calc): 113 mg/dL (calc) (ref <130)
Total CHOL/HDL Ratio: 4.1 (calc) (ref <5.0)
Triglycerides: 133 mg/dL (ref <150)

## 2020-12-17 LAB — COMPLETE METABOLIC PANEL WITH GFR
AG Ratio: 1.5 (calc) (ref 1.0–2.5)
ALT: 13 U/L (ref 6–29)
AST: 14 U/L (ref 10–30)
Albumin: 4 g/dL (ref 3.6–5.1)
Alkaline phosphatase (APISO): 86 U/L (ref 31–125)
BUN: 14 mg/dL (ref 7–25)
CO2: 25 mmol/L (ref 20–32)
Calcium: 9 mg/dL (ref 8.6–10.2)
Chloride: 107 mmol/L (ref 98–110)
Creat: 0.84 mg/dL (ref 0.50–1.10)
GFR, Est African American: 108 mL/min/{1.73_m2} (ref >=60)
GFR, Est Non African American: 93 mL/min/{1.73_m2} (ref >=60)
Globulin: 2.7 g/dL (calc) (ref 1.9–3.7)
Glucose, Bld: 101 mg/dL — ABNORMAL HIGH (ref 65–99)
Potassium: 4.4 mmol/L (ref 3.5–5.3)
Sodium: 141 mmol/L (ref 135–146)
Total Bilirubin: 0.2 mg/dL (ref 0.2–1.2)
Total Protein: 6.7 g/dL (ref 6.1–8.1)

## 2020-12-17 LAB — HCG, SERUM, QUALITATIVE: Preg, Serum: NEGATIVE

## 2020-12-17 LAB — TSH: TSH: 1.94 mIU/L

## 2020-12-17 LAB — LUTEINIZING HORMONE: LH: 7.4 m[IU]/mL

## 2021-01-02 ENCOUNTER — Encounter: Payer: Self-pay | Admitting: Family Medicine

## 2021-01-20 ENCOUNTER — Encounter: Payer: Self-pay | Admitting: Obstetrics and Gynecology

## 2021-01-21 ENCOUNTER — Encounter: Payer: Self-pay | Admitting: Obstetrics and Gynecology

## 2021-01-21 ENCOUNTER — Ambulatory Visit (INDEPENDENT_AMBULATORY_CARE_PROVIDER_SITE_OTHER): Payer: 59 | Admitting: Obstetrics and Gynecology

## 2021-01-21 ENCOUNTER — Other Ambulatory Visit: Payer: Self-pay

## 2021-01-21 VITALS — BP 107/73 | HR 76 | Ht 63.0 in | Wt 206.5 lb

## 2021-01-21 DIAGNOSIS — Z319 Encounter for procreative management, unspecified: Secondary | ICD-10-CM | POA: Diagnosis not present

## 2021-01-21 DIAGNOSIS — E669 Obesity, unspecified: Secondary | ICD-10-CM | POA: Diagnosis not present

## 2021-01-21 DIAGNOSIS — N911 Secondary amenorrhea: Secondary | ICD-10-CM

## 2021-01-21 NOTE — Progress Notes (Signed)
GYNECOLOGY OFFICE VISIT NOTE  History:  30 y.o. G0P0000 here today for concern of secondary amenorrhea and infertility. Pt reports amenorrhea for the past 2 years, with onset immediately after stopping her OCPs. Pt reports that she was previously taking OCPs consistently from age 89 until 2 years ago. For the past 2 years she has been activity trying to conceive without success.   Pt was recently seen by her PCP on 12/16/20 at which time FSH, LH and TSH were normal; pregnancy test was also negative. She was also seen by Dr. Despina Hidden (OBGYN) at [facility] on 03/09/2019 at which time she was prescribed a provera challenge. Pt reports she had 3-4 days of light bleeding but did not have a normal period. She did not have any subsequent follow-up after that visit. She does note 6 months of using home LH kits without any evidence of an LH surge. She denies recent weight changes, excessive hair growth, acne or dyspareunia. Her and her partner have minimal alcohol intake. Her partner has not undergone any workup for infertility. No history of diabetes (A1c wnl in 2020). No prior pelvic ultrasounds.   She denies any abnormal vaginal discharge, bleeding, pelvic pain or other concerns.   Past Medical History:  Diagnosis Date  . ADHD (attention deficit hyperactivity disorder) 03/2004  . Amenorrhea   . Migraine   . PTSD (post-traumatic stress disorder) 01/2010   dx from [facility]  . Vaginal Pap smear, abnormal     Past Surgical History:  Procedure Laterality Date  . ADENOIDECTOMY Bilateral 09/1994  . COLPOSCOPY  2016   per pt  . KNEE ARTHROSCOPY Right 05/2010   debridement  . SHOULDER ARTHROSCOPY    . TONSILLECTOMY Bilateral 04/1995     Current Outpatient Medications:  .  loratadine (CLARITIN) 10 MG tablet, Take 1 tablet (10 mg total) by mouth daily., Disp: 30 tablet, Rfl: 11 .  ondansetron (ZOFRAN) 4 MG tablet, Take 1 tablet (4 mg total) by mouth every 8 (eight) hours as needed for nausea  or vomiting., Disp: 15 tablet, Rfl: 0 .  rizatriptan (MAXALT) 10 MG tablet, Take 1 tablet (10 mg total) by mouth as needed for migraine. May repeat in 2 hours if needed, Disp: 10 tablet, Rfl: 0  The following portions of the patient's history were reviewed and updated as appropriate: allergies, current medications, past family history, past medical history, past social history, past surgical history and problem list.   Health Maintenance:  Last pap: wnl on 12/22/2018 Last mammogram: none  Review of Systems:  Pertinent items noted in HPI and remainder of comprehensive ROS otherwise negative.   Objective:  Physical Exam BP 107/73   Pulse 76   Ht 5\' 3"  (1.6 m)   Wt 206 lb 8 oz (93.7 kg)   LMP 06/19/2018   BMI 36.58 kg/m  CONSTITUTIONAL: Well-developed, well-nourished female in no acute distress.  HENT:  Normocephalic, atraumatic. External right and left ear normal. Moist mucous membranes. EYES: Conjunctivae and EOM are normal. Pupils are equal, round, and reactive to light. No scleral icterus.  NECK: Normal range of motion, supple, no masses SKIN: Skin is warm and dry. No rash noted. Not diaphoretic. No erythema. No pallor. NEUROLOGIC: Alert and oriented to person, place, and time. PSYCHIATRIC: Normal mood and affect. Normal behavior. Normal judgment and thought content. CARDIOVASCULAR: Normal heart rate noted RESPIRATORY: Effort normal, no problems with respiration noted ABDOMEN: Soft, no distention noted.   PELVIC: deferred per pt preference MUSCULOSKELETAL: Normal  range of motion. No edema noted.  Labs and Imaging No results found.  Assessment & Plan:  Ms. Hoes is a 30 y.o. G0P0000 here today for concern of secondary amenorrhea and infertility.  1. Secondary amenorrhea  Encounter for infertility: Pt presents as a referral from her PCP for concern of secondary amenorrhea and infertility. Overall, pt had menarche at age 25 with regular monthly menses on OCPs until 2 years ago  when she stopped birth control (previously taking OCPs since age 6). Since that time, pt has been actively trying to conceive without success. No LH surge observed with 6 months of home LH kits. Pt did have 3-4 days of light bleeding s/p provera challenge from Dr. Despina Hidden (OBGYN) in 02/2019 but no additional workup at that time. Recent workup by PCP showed negative pregnancy test and TSH, FSH, and LH all normal on 12/16/20; A1c wnl on 12/22/2018. No signs/symptoms concerning for pituitary adenoma. No known history of PCOS or endometritis. Outflow tract obstruction less likely given pt did have a withdrawal bleed s/p provera challenge. Of note, pt's partner has not had any workup for infertility. However, after shared decision making with pt will follow-up labs and transvaginal ultrasound as noted below and refer to REI.  - follow-up prolactin, total testosterone, E2, A1c today - Transvaginal ultrasound scheduled today to eval for PCOS - Referral to Reproductive Endocrinology & Infertility (Dr. Fermin Schwab) today - recommended continued use of daily prenatal vitamin  3. Obesity: BMI 36 today. A1c wnl in 2020. No history of diabetes. - encouraged regular exercise and healthy diet to promote healthy weight - f/u repeat A1c today.  Routine preventative health maintenance measures emphasized. Please refer to After Visit Summary for other counseling recommendations.   No follow-ups on file.  Sheila Oats, MD OB Fellow, [facility] 01/21/2021 12:45 PM

## 2021-01-21 NOTE — Patient Instructions (Addendum)
Dr. Governor Specking (Reproductive Endocrinology) [phone]. Referral placed today.  Secondary Amenorrhea  Secondary amenorrhea occurs when a female who was previously having menstrual periods has not had them for 3-6 months. A menstrual period is the monthly shedding of the lining of the uterus. The lining of the uterus is made up of blood, tissue, fluid, and mucus. The flow of blood usually occurs during 3-7 consecutive days each month. This condition has many causes. In many cases, treating the underlying cause will return menstrual periods back to a normal cycle. What are the causes? The most common cause of this condition is pregnancy. Other medical conditions that can cause secondary amenorrhea include:  Cirrhosis of the liver.  Conditions of the blood.  Diabetes.  Epilepsy.  Chronic kidney disease.  Polycystic ovary disease.  A hormonal imbalance.  Ovarian failure.  Cystic fibrosis.  Early menopause.  Cushing syndrome.  Thyroid problems. Other causes may include:  Malnutrition.  Stress or anxiety.  Medicines.  Extreme obesity.  Low body weight or drastic weight loss.  Removal of the ovaries or uterus.  Contraceptive pills, patches, or vaginal rings. What increases the risk? You are more likely to develop this condition if:  You have a family history of this condition.  You have an eating disorder.  You do extreme athletic training.  You have a chronic disease.  You abuse substances such as alcohol or cigarettes. What are the signs or symptoms? The main symptom of this condition is a lack of menstrual periods for 3-6 months in a female who previously had menstrual periods. How is this diagnosed? This condition may be diagnosed based on:  Your medical history.  A physical exam.  A pelvic exam to check for problems with your reproductive organs.  A procedure to examine the uterus.  A measurement of your body mass index (BMI). You may  also have other tests, including:  Blood tests that measure certain hormones in your body and rule out pregnancy.  Urine tests.  Imaging tests, such as an ultrasound, CT scan, or MRI. How is this treated? Treatment for this condition depends on the cause of the amenorrhea. It may involve:  Correcting diet-related problems.  Treating underlying conditions.  Medicines.  Lifestyle changes.  Surgery. If the condition cannot be corrected, it is sometimes possible to start menstrual periods with medicines. Follow these instructions at home: Lifestyle  Maintain a healthy diet. In general, a healthy diet includes lots of fruits and vegetables, low-fat dairy products, lean meats, and foods that contain fiber. Ask to meet with a registered dietitian for nutrition counseling and meal planning.  Maintain a healthy weight. Talk to your health care provider before trying any new diet or exercise plan.  Exercise at least 30 minutes 5 or more days each week. Exercising includes brisk walking, yard work, biking, running, swimming, and team sports like basketball and soccer. Ask your health care provider which exercises are safe for you.  Get enough sleep. Plan your sleep time to allow for 7-9 hours of sleep each night.  Learn to manage stress. Explore relaxation techniques such as meditation, journaling, yoga, or tai chi.      General instructions  Be aware of changes in your menstrual cycle. Keep a record of when you have your menstrual period. Note the date your period starts, how long it lasts, and any problems you experience.  Take over-the-counter and prescription medicines only as told by your health care provider.  Keep all follow-up visits. This is  important. Contact a health care provider if:  Your periods do not return to normal after treatment. Summary  Secondary amenorrhea is when a female who was previously having menstrual periods has not gotten her period for 3-6  months.  This condition has many causes. In many cases, treating the underlying cause will return menstrual periods back to a normal cycle.  Talk to your health care provider if your periods do not return to normal after treatment. This information is not intended to replace advice given to you by your health care provider. Make sure you discuss any questions you have with your health care provider. Document Revised: 05/15/2020 Document Reviewed: 05/15/2020 Elsevier Patient Education  2021 [employer].

## 2021-01-22 LAB — TESTOSTERONE: Testosterone: 21 ng/dL (ref 13–71)

## 2021-01-22 LAB — HEMOGLOBIN A1C
Est. average glucose Bld gHb Est-mCnc: 111 mg/dL
Hgb A1c MFr Bld: 5.5 % (ref 4.8–5.6)

## 2021-01-22 LAB — PROLACTIN: Prolactin: 20.8 ng/mL (ref 4.8–23.3)

## 2021-01-22 LAB — ESTRADIOL: Estradiol: 46.7 pg/mL

## 2021-02-04 ENCOUNTER — Other Ambulatory Visit: Payer: Self-pay

## 2021-02-06 ENCOUNTER — Encounter: Payer: Self-pay | Admitting: Obstetrics & Gynecology

## 2021-02-10 ENCOUNTER — Ambulatory Visit
Admission: RE | Admit: 2021-02-10 | Discharge: 2021-02-10 | Disposition: A | Discharge status: Home / Self Care | Payer: 59 | Source: Ambulatory Visit | Attending: Obstetrics and Gynecology | Admitting: Obstetrics and Gynecology

## 2021-02-10 ENCOUNTER — Other Ambulatory Visit: Payer: Self-pay

## 2021-02-10 DIAGNOSIS — Z319 Encounter for procreative management, unspecified: Secondary | ICD-10-CM | POA: Insufficient documentation

## 2021-02-10 DIAGNOSIS — N911 Secondary amenorrhea: Secondary | ICD-10-CM

## 2021-02-11 ENCOUNTER — Other Ambulatory Visit: Payer: Self-pay | Admitting: *Deleted

## 2021-02-11 DIAGNOSIS — J302 Other seasonal allergic rhinitis: Secondary | ICD-10-CM

## 2021-02-11 MED ORDER — LORATADINE 10 MG PO TABS
10.0000 mg | ORAL_TABLET | Freq: Every day | ORAL | 11 refills | Status: AC
Start: 2021-02-11 — End: ?

## 2021-02-19 ENCOUNTER — Encounter: Payer: Self-pay | Admitting: Family Medicine

## 2021-05-05 ENCOUNTER — Other Ambulatory Visit (HOSPITAL_COMMUNITY): Payer: Self-pay

## 2021-08-11 ENCOUNTER — Telehealth: Payer: Self-pay | Admitting: *Deleted

## 2021-08-11 NOTE — Telephone Encounter (Signed)
Received call from patient.   Reports that she is having cough, sore throat, and feels that she has Strep. States that she gets Strep every year and would like ABTX, specifically a Z-Pack.   Advised that appointment will need to be done for evaluation prior to ABTx prescription.   Patient noted upset that appointment is requested. States that prior MD would call out ABTx at her request.   Again advised that appointment will be needed.

## 2021-08-21 ENCOUNTER — Other Ambulatory Visit: Payer: Self-pay | Admitting: Family Medicine

## 2021-08-21 MED ORDER — ONDANSETRON HCL 4 MG PO TABS: 4.0000 mg | ORAL_TABLET | Freq: Three times a day (TID) | ORAL | 0 refills | Status: AC | PRN

## 2022-01-22 ENCOUNTER — Other Ambulatory Visit: Payer: Self-pay | Admitting: Family Medicine

## 2022-08-01 IMAGING — US US TRANSVAGINAL NON-OB
1 series · 15 of 25 positions shown · non-contrast
Comparison: None.

CLINICAL DATA: Secondary amenorrhea.

EXAM:
ULTRASOUND PELVIS TRANSVAGINAL
TECHNIQUE: Transvaginal ultrasound examination of the pelvis was performed
including evaluation of the uterus, ovaries, adnexal regions, and
pelvic cul-de-sac.

[Series 1: us transvaginal non-ob · 15 of 68 slices shown]
[im 1/68]
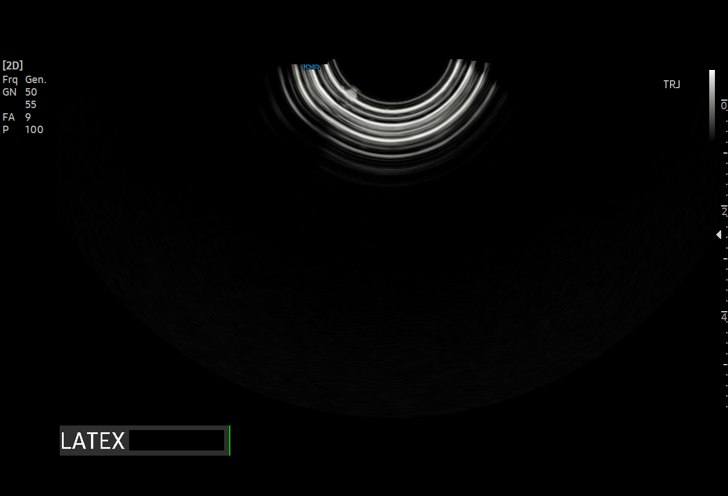
[im 6/68]
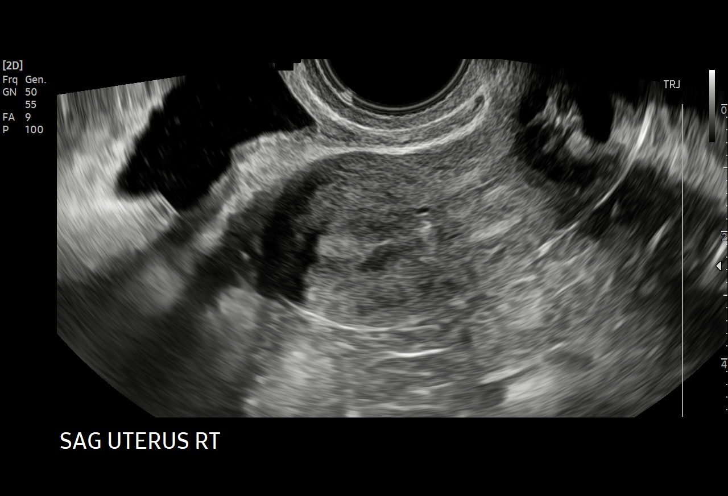
[im 12/68]
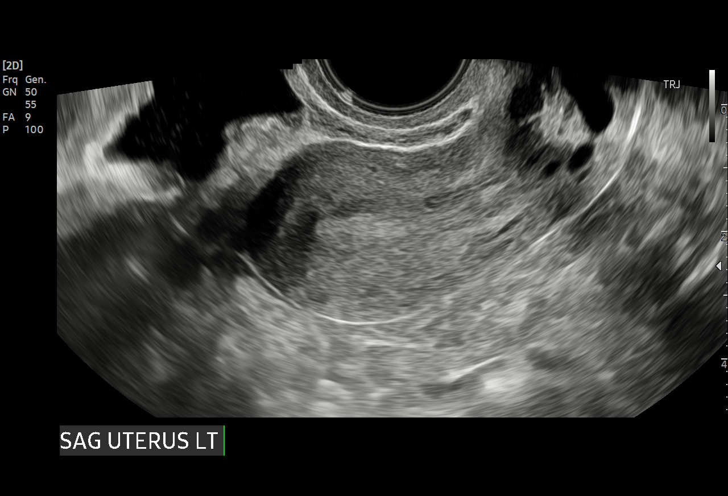
[im 14/68]
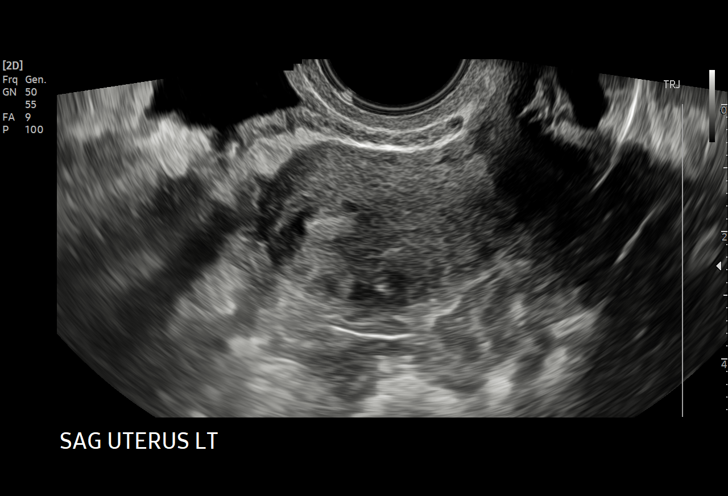
[im 20/68]
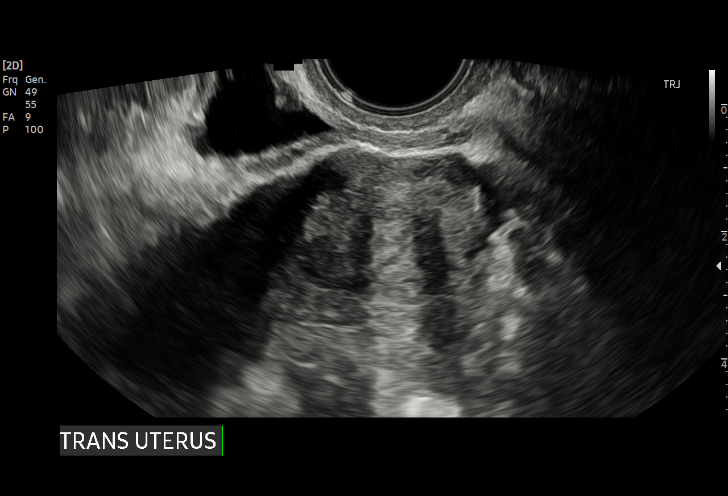
[im 26/68]
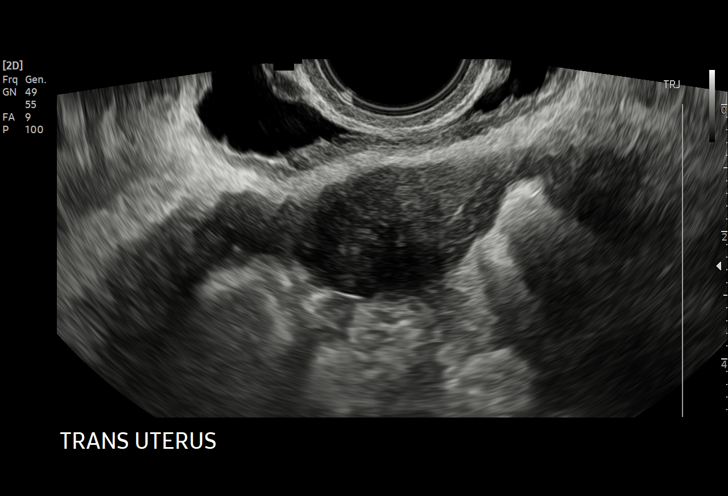
[im 28/68]
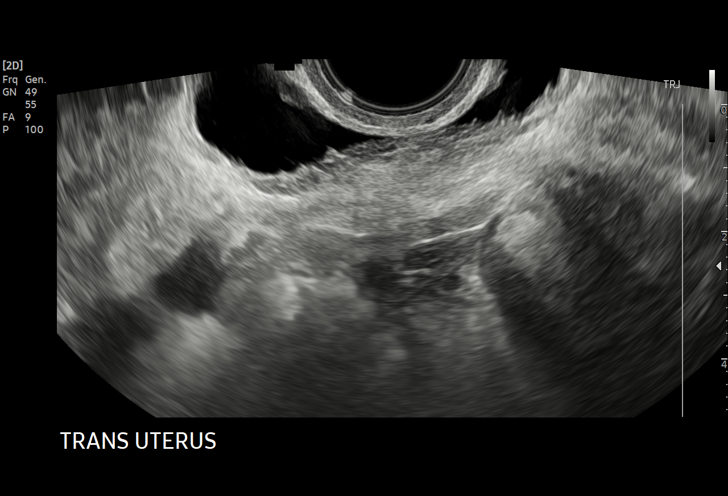
[im 34/68]
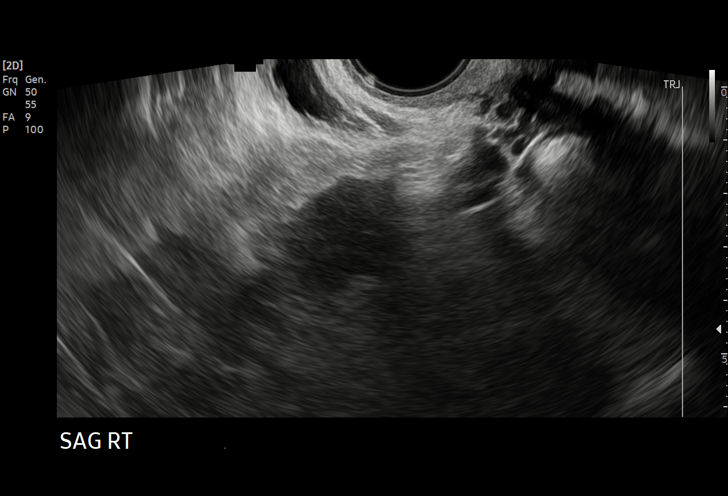
[im 40/68]
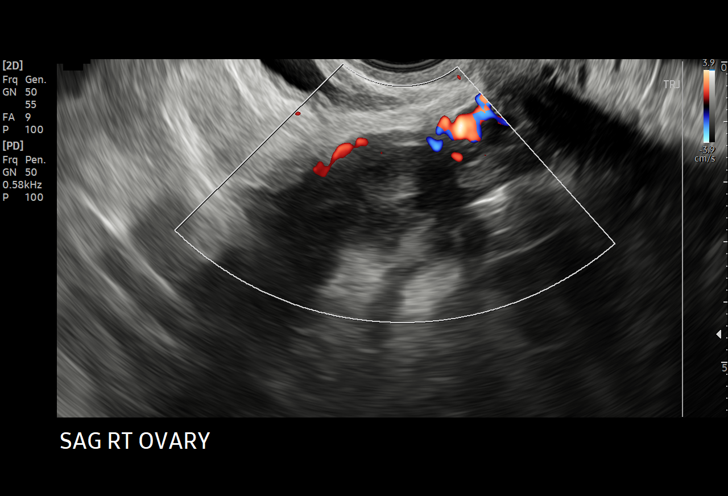
[im 42/68]
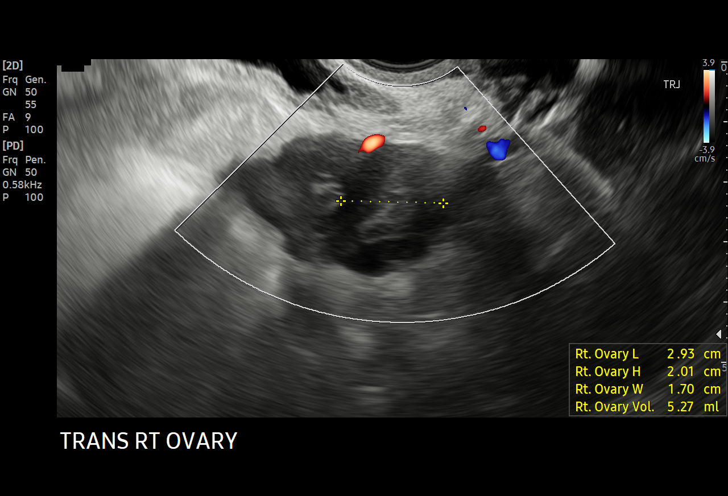
[im 48/68]
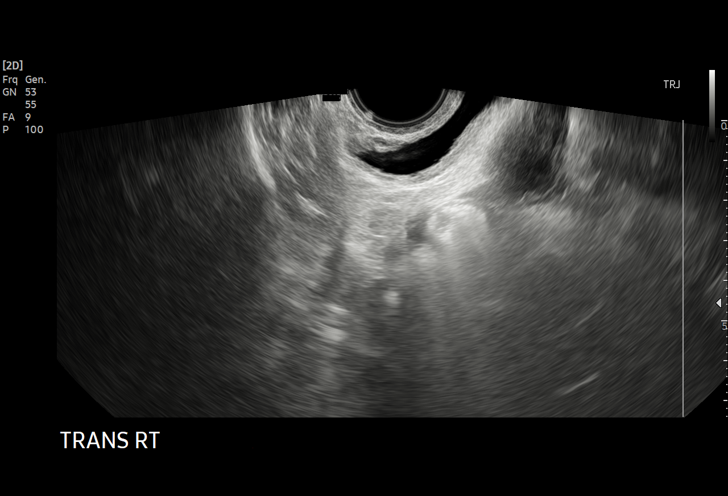
[im 54/68]
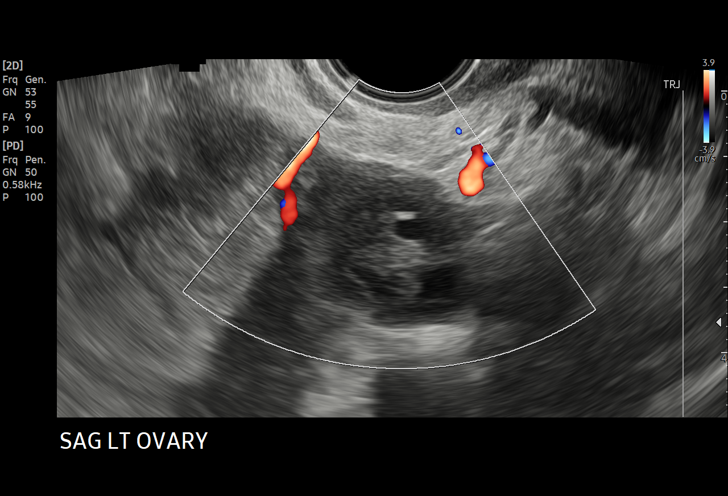
[im 56/68]
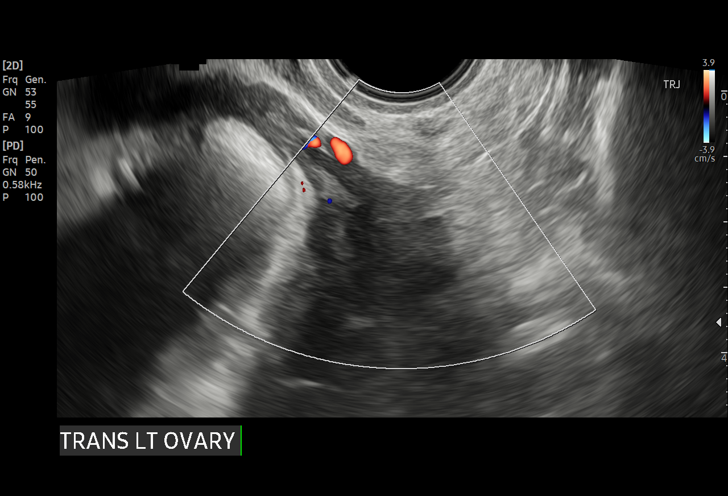
[im 62/68]
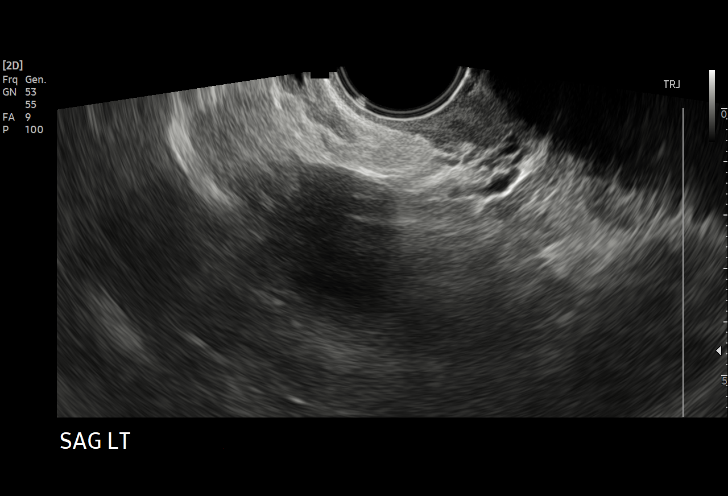
[im 68/68]
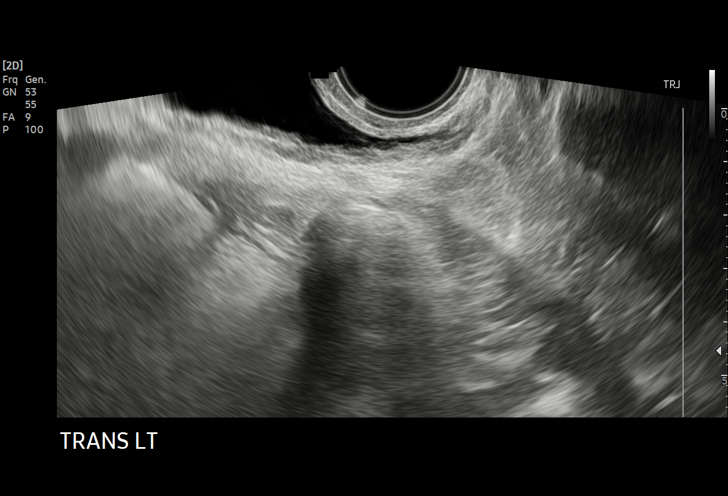

[15 of 25 positions shown; findings below may reference images not displayed]

FINDINGS: Uterus

Measurements: 6.7 x 2.9 x 3.8 cm = volume: 39 mL. No fibroids or
other mass visualized.

Endometrium

Thickness: 6 mm.  No focal abnormality visualized.

Right ovary

Measurements: 2.9 x 2 x 1.7 cm = volume: 5.3 mL. Normal
appearance/no adnexal mass.

Left ovary

Measurements: 3.1 x 2.3 x 2.2 cm = volume: 8.1 mL. Normal
appearance/no adnexal mass.

Other findings:  No abnormal free fluid
IMPRESSION: Unremarkable examination.
